# Patient Record
Sex: Male | Born: 1985 | Race: Black or African American | Hispanic: No | Marital: Single | Smoking: Current every day smoker
Health system: Southern US, Community
[De-identification: ages and names within clinical notes are randomized; demographics above are authoritative.]

## PROBLEM LIST (undated history)

## (undated) ENCOUNTER — Emergency Department: Admission: EM | Payer: Self-pay | Source: Home / Self Care

## (undated) DIAGNOSIS — J45909 Unspecified asthma, uncomplicated: Secondary | ICD-10-CM

---

## 2011-03-12 ENCOUNTER — Other Ambulatory Visit (HOSPITAL_BASED_OUTPATIENT_CLINIC_OR_DEPARTMENT_OTHER): Payer: Self-pay | Admitting: Surgery

## 2011-03-12 ENCOUNTER — Other Ambulatory Visit (EMERGENCY_DEPARTMENT_HOSPITAL): Payer: Self-pay

## 2011-03-12 ENCOUNTER — Other Ambulatory Visit (EMERGENCY_DEPARTMENT_HOSPITAL): Payer: Self-pay | Admitting: Emergency Medicine

## 2011-03-12 ENCOUNTER — Other Ambulatory Visit (HOSPITAL_COMMUNITY): Payer: Self-pay

## 2011-03-12 ENCOUNTER — Inpatient Hospital Stay (HOSPITAL_COMMUNITY): Payer: No Typology Code available for payment source | Admitting: Emergency Medicine

## 2011-03-12 ENCOUNTER — Inpatient Hospital Stay (HOSPITAL_BASED_OUTPATIENT_CLINIC_OR_DEPARTMENT_OTHER)
Admission: EM | Admit: 2011-03-12 | Discharge: 2011-05-01 | DRG: 793 | Disposition: A | Discharge status: Home / Self Care | Payer: No Typology Code available for payment source | Attending: Orthopaedic Surgery | Admitting: Orthopaedic Surgery

## 2011-03-12 DIAGNOSIS — Y990 Civilian activity done for income or pay: Secondary | ICD-10-CM

## 2011-03-12 DIAGNOSIS — S322XXA Fracture of coccyx, initial encounter for closed fracture: Secondary | ICD-10-CM | POA: Diagnosis present

## 2011-03-12 DIAGNOSIS — S72009A Fracture of unspecified part of neck of unspecified femur, initial encounter for closed fracture: Secondary | ICD-10-CM | POA: Diagnosis present

## 2011-03-12 DIAGNOSIS — Y831 Surgical operation with implant of artificial internal device as the cause of abnormal reaction of the patient, or of later complication, without mention of misadventure at the time of the procedure: Secondary | ICD-10-CM | POA: Diagnosis not present

## 2011-03-12 DIAGNOSIS — J9589 Other postprocedural complications and disorders of respiratory system, not elsewhere classified: Secondary | ICD-10-CM | POA: Diagnosis not present

## 2011-03-12 DIAGNOSIS — K661 Hemoperitoneum: Secondary | ICD-10-CM | POA: Diagnosis present

## 2011-03-12 DIAGNOSIS — D62 Acute posthemorrhagic anemia: Secondary | ICD-10-CM | POA: Diagnosis not present

## 2011-03-12 DIAGNOSIS — T847XXA Infection and inflammatory reaction due to other internal orthopedic prosthetic devices, implants and grafts, initial encounter: Secondary | ICD-10-CM | POA: Diagnosis not present

## 2011-03-12 DIAGNOSIS — S3210XA Unspecified fracture of sacrum, initial encounter for closed fracture: Secondary | ICD-10-CM | POA: Diagnosis present

## 2011-03-12 DIAGNOSIS — S3600XA Unspecified injury of spleen, initial encounter: Principal | ICD-10-CM | POA: Diagnosis present

## 2011-03-12 DIAGNOSIS — M869 Osteomyelitis, unspecified: Secondary | ICD-10-CM | POA: Diagnosis not present

## 2011-03-12 DIAGNOSIS — S22009A Unspecified fracture of unspecified thoracic vertebra, initial encounter for closed fracture: Secondary | ICD-10-CM | POA: Diagnosis present

## 2011-03-12 DIAGNOSIS — S71109A Unspecified open wound, unspecified thigh, initial encounter: Secondary | ICD-10-CM | POA: Diagnosis present

## 2011-03-12 DIAGNOSIS — S72309A Unspecified fracture of shaft of unspecified femur, initial encounter for closed fracture: Secondary | ICD-10-CM | POA: Diagnosis present

## 2011-03-12 DIAGNOSIS — Y92410 Unspecified street and highway as the place of occurrence of the external cause: Secondary | ICD-10-CM

## 2011-03-12 LAB — URINALYSIS COMPLETE, URN
Bacteria, URN: NONE SEEN
Bacteria, URN: NONE SEEN
Bilirubin (Qual), URN: NEGATIVE
Bilirubin (Qual), URN: NEGATIVE
Cast_Hyaline, URN: <1 /LPF — AB
Epith Cells_Renal/Trans,URN: NEGATIVE /HPF
Epith Cells_Renal/Trans,URN: NEGATIVE /HPF
Epith Cells_Squamous, URN: NEGATIVE /LPF
Epith Cells_Squamous, URN: NEGATIVE /LPF
Ketones, URN: NEGATIVE mg/dL
Ketones, URN: NEGATIVE mg/dL
Leukocyte Esterase, URN: NEGATIVE
Leukocyte Esterase, URN: NEGATIVE
Nitrite, URN: NEGATIVE
Nitrite, URN: NEGATIVE
Protein (Alb Semiquant), URN: NEGATIVE mg/dL
Protein (Alb Semiquant), URN: NEGATIVE mg/dL
RBC, URN: NEGATIVE /HPF
Specific Gravity, URN: >1.04 g/mL — ABNORMAL HIGH (ref 1.002–1.027)
Specific Gravity, URN: >1.04 g/mL — ABNORMAL HIGH (ref 1.002–1.027)
WBC, URN: NEGATIVE /HPF
WBC, URN: NEGATIVE /HPF

## 2011-03-12 LAB — EMERGENCY HEMORRHAGE PANEL
Fibrinogen: 152 mg/dL (ref 150–400)
Fibrinogen: 193 mg/dL (ref 150–400)
Hematocrit: 36 % — ABNORMAL LOW (ref 38–50)
Hematocrit: 32 % — ABNORMAL LOW (ref 38–50)
Platelet Count: 102 10*3/uL — ABNORMAL LOW (ref 150–400)
Platelet Count: 117 10*3/uL — ABNORMAL LOW (ref 150–400)
Prothrombin INR: 1.3 (ref 0.8–1.3)
Prothrombin INR: 1.3 (ref 0.8–1.3)
Prothrombin Time Patient: 15.2 s (ref 10.7–15.6)
Prothrombin Time Patient: 15.3 s (ref 10.7–15.6)

## 2011-03-12 LAB — COMPREHENSIVE METABOLIC PANEL
ALT (GPT): 84 U/L — ABNORMAL HIGH (ref 10–64)
AST (GOT): 63 U/L — ABNORMAL HIGH (ref 15–40)
Albumin: 2.3 g/dL — ABNORMAL LOW (ref 3.5–5.2)
Alkaline Phosphatase (Total): 43 U/L (ref 35–109)
Anion Gap: 9 (ref 3–11)
Bilirubin (Total): 0.9 mg/dL (ref 0.2–1.3)
Calcium: 6.9 mg/dL — ABNORMAL LOW (ref 8.9–10.2)
Carbon Dioxide, Total: 21 mEq/L — ABNORMAL LOW (ref 22–32)
Chloride: 112 mEq/L — ABNORMAL HIGH (ref 98–108)
Creatinine: 0.98 mg/dL (ref 0.51–1.18)
GFR, Calc, African American: >60 mL/min (ref >59)
GFR, Calc, European American: >60 mL/min (ref >59)
Glucose: 227 mg/dL — ABNORMAL HIGH (ref 62–125)
Potassium: 3.9 mEq/L (ref 3.7–5.2)
Protein (Total): 3.5 g/dL — ABNORMAL LOW (ref 6.0–8.2)
Sodium: 142 mEq/L (ref 136–145)
Urea Nitrogen: 19 mg/dL (ref 8–21)

## 2011-03-12 LAB — CBC HEMORRHAGE PANEL
Fibrinogen: 138 mg/dL — ABNORMAL LOW (ref 150–400)
Hematocrit: 29 % — ABNORMAL LOW (ref 38–50)
Hemoglobin: 10.1 g/dL — ABNORMAL LOW (ref 13.0–18.0)
MCH: 31.6 pg (ref 27.3–33.6)
MCHC: 35.2 g/dL (ref 32.2–36.5)
MCV: 90 fL (ref 81–98)
Platelet Count: 167 10*3/uL (ref 150–400)
Prothrombin INR: 1.3 (ref 0.8–1.3)
Prothrombin Time Patient: 15.4 s (ref 10.7–15.6)
RBC: 3.2 mil/uL — ABNORMAL LOW (ref 4.40–5.60)
RDW-CV: 11.8 % (ref 11.6–14.4)
WBC: 29.89 10*3/uL — ABNORMAL HIGH (ref 4.30–10.00)

## 2011-03-12 LAB — BLOOD GAS PANEL 9, ART
Bicarbonate: 19 mEq/L — ABNORMAL LOW (ref 22–26)
Bicarbonate: 18 mEq/L — ABNORMAL LOW (ref 22–26)
Bicarbonate: 20 mEq/L — ABNORMAL LOW (ref 22–26)
Bicarbonate: 20 mEq/L — ABNORMAL LOW (ref 22–26)
Calcium (Ionized): 1.05 mmol/L — ABNORMAL LOW (ref 1.18–1.38)
Calcium (Ionized): 0.98 mmol/L — ABNORMAL LOW (ref 1.18–1.38)
Calcium (Ionized): 1 mmol/L — ABNORMAL LOW (ref 1.18–1.38)
Calcium (Ionized): 1.01 mmol/L — ABNORMAL LOW (ref 1.18–1.38)
Carboxyhemoglobin, ART: 1 %
Carboxyhemoglobin, ART: 1 %
Carboxyhemoglobin, ART: 1.3 %
Carboxyhemoglobin, ART: 1.3 %
Glucose: 152 mg/dL — ABNORMAL HIGH (ref 62–125)
Glucose: 162 mg/dL — ABNORMAL HIGH (ref 62–125)
Glucose: 132 mg/dL — ABNORMAL HIGH (ref 62–125)
Glucose: 127 mg/dL — ABNORMAL HIGH (ref 62–125)
Hematocrit: 35 % — ABNORMAL LOW (ref 38–50)
Hematocrit: 35 % — ABNORMAL LOW (ref 38–50)
Hematocrit: 34 % — ABNORMAL LOW (ref 38–50)
Hematocrit: 32 % — ABNORMAL LOW (ref 38–50)
L Lactate (Direct), ART WB: 5.5 mmol/L — ABNORMAL HIGH (ref 0.4–1.0)
L Lactate (Direct), ART WB: 6.1 mmol/L — ABNORMAL HIGH (ref 0.4–1.0)
L Lactate (Direct), ART WB: 5.8 mmol/L — ABNORMAL HIGH (ref 0.4–1.0)
L Lactate (Direct), ART WB: 5.8 mmol/L — ABNORMAL HIGH (ref 0.4–1.0)
Methemoglobin, ART: 1.2 % (ref <3.0)
Methemoglobin, ART: 1.1 % (ref <3.0)
Methemoglobin, ART: 1.1 % (ref <3.0)
Methemoglobin, ART: 1.1 % (ref <3.0)
O2 Content: 16.6 VOL% (ref 15–23)
O2 Content: 15.7 VOL% (ref 15–23)
O2 Content: 15.3 VOL% (ref 15–23)
O2 Content: 14.2 VOL% — ABNORMAL LOW (ref 15–23)
O2 Sat (Frac.), ART: 98 % (ref 94–99)
O2 Sat (Frac.), ART: 96 % (ref 94–99)
O2 Sat (Frac.), ART: 97 % (ref 94–99)
O2 Sat (Frac.), ART: 97 % (ref 94–99)
Potassium: 5.3 mEq/L — ABNORMAL HIGH (ref 3.7–5.2)
Potassium: 5.1 mEq/L (ref 3.7–5.2)
Potassium: 5 mEq/L (ref 3.7–5.2)
Potassium: 4.5 mEq/L (ref 3.7–5.2)
Sodium: 137 mEq/L (ref 136–145)
Sodium: 137 mEq/L (ref 136–145)
Sodium: 138 mEq/L (ref 136–145)
Sodium: 138 mEq/L (ref 136–145)
Total Hemoglobin, ART: 11.4 g/dL — ABNORMAL LOW (ref 13.0–18.0)
Total Hemoglobin, ART: 11.5 g/dL — ABNORMAL LOW (ref 13.0–18.0)
Total Hemoglobin, ART: 10.9 g/dL — ABNORMAL LOW (ref 13.0–18.0)
Total Hemoglobin, ART: 10.2 g/dL — ABNORMAL LOW (ref 13.0–18.0)
pCO2, ART: 38 mm Hg (ref 33–48)
pCO2, ART: 39 mm Hg (ref 33–48)
pCO2, ART: 37 mm Hg (ref 33–48)
pCO2, ART: 35 mm Hg (ref 33–48)
pH, ART: 7.31 — ABNORMAL LOW (ref 7.35–7.45)
pH, ART: 7.28 — ABNORMAL LOW (ref 7.35–7.45)
pH, ART: 7.36 (ref 7.35–7.45)
pH, ART: 7.38 (ref 7.35–7.45)
pO2: 389 mm Hg — ABNORMAL HIGH (ref 80–104)
pO2: 115 mm Hg — ABNORMAL HIGH (ref 80–104)
pO2: 169 mm Hg — ABNORMAL HIGH (ref 80–104)
pO2: 162 mm Hg — ABNORMAL HIGH (ref 80–104)

## 2011-03-12 LAB — CODE PANEL, ARTERIAL, W/ LACT, ICA, NA, K, GLU, HBCO, MET HGB
Base Deficit Blood, ART: 7 mEq/L
Base Deficit Blood, ART: 8.9 mEq/L
Base Deficit Blood, ART: NEGATIVE
Base Deficit Blood, ART: NEGATIVE
Base Deficit Blood, ART: 6.4 mEq/L
Base Deficit Blood, ART: NEGATIVE
Bicarbonate: 19 mEq/L — ABNORMAL LOW (ref 22–26)
Bicarbonate: 20 mEq/L — ABNORMAL LOW (ref 22–26)
Bicarbonate: 20 mEq/L — ABNORMAL LOW (ref 22–26)
Calcium (Ionized): 1.06 mmol/L — ABNORMAL LOW (ref 1.18–1.38)
Calcium (Ionized): 1.07 mmol/L — ABNORMAL LOW (ref 1.18–1.38)
Calcium (Ionized): 1.02 mmol/L — ABNORMAL LOW (ref 1.18–1.38)
Carboxyhemoglobin, ART: 0.7 %
Carboxyhemoglobin, ART: 0.8 %
Carboxyhemoglobin, ART: 0.6 %
Glucose: 217 mg/dL — ABNORMAL HIGH (ref 62–125)
Glucose: 218 mg/dL — ABNORMAL HIGH (ref 62–125)
Glucose: 177 mg/dL — ABNORMAL HIGH (ref 62–125)
Hematocrit: 31 % — ABNORMAL LOW (ref 38–50)
Hematocrit: 38 % (ref 38–50)
Hematocrit: 39 % (ref 38–50)
Hemoglobin: 10 g/dL — ABNORMAL LOW (ref 13.0–18.0)
Hemoglobin: 12.5 g/dL — ABNORMAL LOW (ref 13.0–18.0)
Hemoglobin: 12.8 g/dL — ABNORMAL LOW (ref 13.0–18.0)
L Lactate (Direct), ART WB: 4.8 mmol/L — ABNORMAL HIGH (ref 0.4–1.0)
L Lactate (Direct), ART WB: 5.4 mmol/L — ABNORMAL HIGH (ref 0.4–1.0)
L Lactate (Direct), ART WB: 5.2 mmol/L — ABNORMAL HIGH (ref 0.4–1.0)
Methemoglobin, ART: 1.3 % (ref <3.0)
Methemoglobin, ART: 1.3 % (ref <3.0)
Methemoglobin, ART: 0.6 % (ref <3.0)
O2 Content: 13.7 VOL% — ABNORMAL LOW (ref 15–23)
O2 Content: 17.9 VOL% (ref 15–23)
O2 Content: 18.9 VOL% (ref 15–23)
O2 Sat (Frac.), ART: 96 % (ref 94–99)
O2 Sat (Frac.), ART: 97 % (ref 94–99)
O2 Sat (Frac.), ART: 99 % (ref 94–99)
O2 Sat (Func.), ART: 98 %
O2 Sat (Func.), ART: 99 %
O2 Sat (Func.), ART: 100 %
Potassium: 3.8 mEq/L (ref 3.7–5.2)
Potassium: 4.3 mEq/L (ref 3.7–5.2)
Potassium: 5.2 mEq/L (ref 3.7–5.2)
Sodium: 136 mEq/L (ref 136–145)
Sodium: 138 mEq/L (ref 136–145)
Sodium: 139 mEq/L (ref 136–145)
Total Hemoglobin, ART: 10 g/dL — ABNORMAL LOW (ref 13.0–18.0)
Total Hemoglobin, ART: 12.5 g/dL — ABNORMAL LOW (ref 13.0–18.0)
Total Hemoglobin, ART: 12.8 g/dL — ABNORMAL LOW (ref 13.0–18.0)
pCO2, ART: 49 mm Hg — ABNORMAL HIGH (ref 33–48)
pCO2, ART: 53 mm Hg — ABNORMAL HIGH (ref 33–48)
pCO2, ART: 47 mm Hg (ref 33–48)
pH, ART: 7.2 — ABNORMAL LOW (ref 7.35–7.45)
pH, ART: 7.21 — ABNORMAL LOW (ref 7.35–7.45)
pH, ART: 7.26 — ABNORMAL LOW (ref 7.35–7.45)
pO2: 128 mm Hg — ABNORMAL HIGH (ref 80–104)
pO2: 338 mm Hg — ABNORMAL HIGH (ref 80–104)
pO2: 442 mm Hg — ABNORMAL HIGH (ref 80–104)

## 2011-03-12 LAB — BLOOD GAS, ARTERIAL (NO ELECTROLYTES)
Base Deficit Blood, ART: 4.2 mEq/L
Base Deficit Blood, ART: NEGATIVE
Base Deficit Blood, ART: 2.7 mEq/L
Base Deficit Blood, ART: NEGATIVE
Base Deficit Blood, ART: 1 mEq/L
Base Deficit Blood, ART: NEGATIVE
Bicarbonate: 21 mEq/L — ABNORMAL LOW (ref 22–26)
Bicarbonate: 22 mEq/L (ref 22–26)
Bicarbonate: 23 mEq/L (ref 22–26)
Fi (O2) [%]: 50 %
Fi (O2) [%]: 40 %
Fi (O2) [%]: 40 %
PEEP (Pos End Expir Pres): 5
PEEP (Pos End Expir Pres): 5
PEEP (Pos End Expir Pres): 5
pCO2, ART: 43 mm Hg (ref 33–48)
pCO2, ART: 38 mm Hg (ref 33–48)
pCO2, ART: 37 mm Hg (ref 33–48)
pH, ART: 7.32 — ABNORMAL LOW (ref 7.35–7.45)
pH, ART: 7.37 (ref 7.35–7.45)
pH, ART: 7.41 (ref 7.35–7.45)
pO2: 177 mm Hg — ABNORMAL HIGH (ref 80–104)
pO2: 172 mm Hg — ABNORMAL HIGH (ref 80–104)
pO2: 160 mm Hg — ABNORMAL HIGH (ref 80–104)

## 2011-03-12 LAB — PHOSPHATE
Phosphate: 4.1 mg/dL (ref 2.5–4.5)
Phosphate: 2.8 mg/dL (ref 2.5–4.5)

## 2011-03-12 LAB — BASIC METABOLIC PANEL
Anion Gap: 8 (ref 3–11)
Anion Gap: 7 (ref 3–11)
Calcium: 6.6 mg/dL — ABNORMAL LOW (ref 8.9–10.2)
Calcium: 6.9 mg/dL — ABNORMAL LOW (ref 8.9–10.2)
Carbon Dioxide, Total: 22 mEq/L (ref 22–32)
Carbon Dioxide, Total: 23 mEq/L (ref 22–32)
Chloride: 109 mEq/L — ABNORMAL HIGH (ref 98–108)
Chloride: 109 mEq/L — ABNORMAL HIGH (ref 98–108)
Creatinine: 0.95 mg/dL (ref 0.51–1.18)
Creatinine: 0.86 mg/dL (ref 0.51–1.18)
GFR, Calc, African American: >60 mL/min (ref >59)
GFR, Calc, African American: >60 mL/min (ref >59)
GFR, Calc, European American: >60 mL/min (ref >59)
GFR, Calc, European American: >60 mL/min (ref >59)
Glucose: 143 mg/dL — ABNORMAL HIGH (ref 62–125)
Glucose: 159 mg/dL — ABNORMAL HIGH (ref 62–125)
Potassium: 4.5 mEq/L (ref 3.7–5.2)
Potassium: 4.1 mEq/L (ref 3.7–5.2)
Sodium: 139 mEq/L (ref 136–145)
Sodium: 139 mEq/L (ref 136–145)
Urea Nitrogen: 14 mg/dL (ref 8–21)
Urea Nitrogen: 14 mg/dL (ref 8–21)

## 2011-03-12 LAB — PROTHROMBIN & PTT
Partial Thromboplastin Time: 28 s (ref 22–35)
Prothrombin INR: 1.3 (ref 0.8–1.3)
Prothrombin Time Patient: 15.2 s (ref 10.7–15.6)

## 2011-03-12 LAB — CBC (HEMOGRAM)
Hematocrit: 27 % — ABNORMAL LOW (ref 38–50)
Hematocrit: 27 % — ABNORMAL LOW (ref 38–50)
Hemoglobin: 9.8 g/dL — ABNORMAL LOW (ref 13.0–18.0)
Hemoglobin: 9.8 g/dL — ABNORMAL LOW (ref 13.0–18.0)
MCH: 31.1 pg (ref 27.3–33.6)
MCH: 30.9 pg (ref 27.3–33.6)
MCHC: 35.8 g/dL (ref 32.2–36.5)
MCHC: 35.8 g/dL (ref 32.2–36.5)
MCV: 87 fL (ref 81–98)
MCV: 86 fL (ref 81–98)
Platelet Count: 121 10*3/uL — ABNORMAL LOW (ref 150–400)
Platelet Count: 120 10*3/uL — ABNORMAL LOW (ref 150–400)
RBC: 3.15 mil/uL — ABNORMAL LOW (ref 4.40–5.60)
RBC: 3.17 mil/uL — ABNORMAL LOW (ref 4.40–5.60)
RDW-CV: 12.8 % (ref 11.6–14.4)
RDW-CV: 12.8 % (ref 11.6–14.4)
WBC: 11.78 10*3/uL — ABNORMAL HIGH (ref 4.30–10.00)
WBC: 13.84 10*3/uL — ABNORMAL HIGH (ref 4.30–10.00)

## 2011-03-12 LAB — AMYLASE: Amylase Total: 38 U/L (ref 27–106)

## 2011-03-12 LAB — CALCIUM (IONIZED), WB
Calcium (Ionized): 1.02 mmol/L — ABNORMAL LOW (ref 1.18–1.38)
Calcium (Ionized): 1.01 mmol/L — ABNORMAL LOW (ref 1.18–1.38)

## 2011-03-12 LAB — L_LACTATE, ARTERIAL WHOLE BLOOD
L Lactate (Direct), ART WB: 6.3 mmol/L — ABNORMAL HIGH (ref 0.4–1.0)
L Lactate (Direct), ART WB: 4.7 mmol/L — ABNORMAL HIGH (ref 0.4–1.0)
L Lactate (Direct), ART WB: 4.8 mmol/L — ABNORMAL HIGH (ref 0.4–1.0)

## 2011-03-12 LAB — MAGNESIUM
Magnesium: 1.6 mg/dL — ABNORMAL LOW (ref 1.8–2.4)
Magnesium: 1.6 mg/dL — ABNORMAL LOW (ref 1.8–2.4)

## 2011-03-12 LAB — 1ST EXTRA LIME GREEN TOP

## 2011-03-12 LAB — CREATININE, WBLD: Creatinine: 0.88 mg/dL (ref 0.51–1.18)

## 2011-03-12 LAB — ALCOHOL (ETHYL): Alcohol (Ethyl): NEGATIVE mg/dL

## 2011-03-13 ENCOUNTER — Other Ambulatory Visit (HOSPITAL_COMMUNITY): Payer: Self-pay

## 2011-03-13 ENCOUNTER — Other Ambulatory Visit (HOSPITAL_COMMUNITY): Payer: Self-pay | Admitting: Anesthesiology

## 2011-03-13 LAB — BASIC METABOLIC PANEL
Anion Gap: 8 (ref 3–11)
Anion Gap: 6 (ref 3–11)
Anion Gap: 3 (ref 3–11)
Anion Gap: 3 (ref 3–11)
Calcium: 7 mg/dL — ABNORMAL LOW (ref 8.9–10.2)
Calcium: 7.2 mg/dL — ABNORMAL LOW (ref 8.9–10.2)
Calcium: 7.2 mg/dL — ABNORMAL LOW (ref 8.9–10.2)
Calcium: 7.2 mg/dL — ABNORMAL LOW (ref 8.9–10.2)
Carbon Dioxide, Total: 25 mEq/L (ref 22–32)
Carbon Dioxide, Total: 26 mEq/L (ref 22–32)
Carbon Dioxide, Total: 29 mEq/L (ref 22–32)
Carbon Dioxide, Total: 30 mEq/L (ref 22–32)
Chloride: 106 mEq/L (ref 98–108)
Chloride: 108 mEq/L (ref 98–108)
Chloride: 109 mEq/L — ABNORMAL HIGH (ref 98–108)
Chloride: 107 mEq/L (ref 98–108)
Creatinine: 0.89 mg/dL (ref 0.51–1.18)
Creatinine: 0.83 mg/dL (ref 0.51–1.18)
Creatinine: 0.77 mg/dL (ref 0.51–1.18)
Creatinine: 0.66 mg/dL (ref 0.51–1.18)
GFR, Calc, African American: >60 mL/min (ref >59)
GFR, Calc, African American: >60 mL/min (ref >59)
GFR, Calc, African American: >60 mL/min (ref >59)
GFR, Calc, African American: >60 mL/min (ref >59)
GFR, Calc, European American: >60 mL/min (ref >59)
GFR, Calc, European American: >60 mL/min (ref >59)
GFR, Calc, European American: >60 mL/min (ref >59)
GFR, Calc, European American: >60 mL/min (ref >59)
Glucose: 140 mg/dL — ABNORMAL HIGH (ref 62–125)
Glucose: 136 mg/dL — ABNORMAL HIGH (ref 62–125)
Glucose: 134 mg/dL — ABNORMAL HIGH (ref 62–125)
Glucose: 135 mg/dL — ABNORMAL HIGH (ref 62–125)
Potassium: 3.8 mEq/L (ref 3.7–5.2)
Potassium: 3.6 mEq/L — ABNORMAL LOW (ref 3.7–5.2)
Potassium: 4.1 mEq/L (ref 3.7–5.2)
Potassium: 3.8 mEq/L (ref 3.7–5.2)
Sodium: 139 mEq/L (ref 136–145)
Sodium: 140 mEq/L (ref 136–145)
Sodium: 141 mEq/L (ref 136–145)
Sodium: 140 mEq/L (ref 136–145)
Urea Nitrogen: 14 mg/dL (ref 8–21)
Urea Nitrogen: 14 mg/dL (ref 8–21)
Urea Nitrogen: 13 mg/dL (ref 8–21)
Urea Nitrogen: 12 mg/dL (ref 8–21)

## 2011-03-13 LAB — L_LACTATE, ARTERIAL WHOLE BLOOD
L Lactate (Direct), ART WB: 3.6 mmol/L — ABNORMAL HIGH (ref 0.4–1.0)
L Lactate (Direct), ART WB: 3.2 mmol/L — ABNORMAL HIGH (ref 0.4–1.0)
L Lactate (Direct), ART WB: 2.2 mmol/L — ABNORMAL HIGH (ref 0.4–1.0)
L Lactate (Direct), ART WB: 1.4 mmol/L — ABNORMAL HIGH (ref 0.4–1.0)
L Lactate (Direct), ART WB: 0.9 mmol/L (ref 0.4–1.0)
L Lactate (Direct), ART WB: 1.2 mmol/L — ABNORMAL HIGH (ref 0.4–1.0)

## 2011-03-13 LAB — PREPARE CRYOPRECIPITATE: Units Ordered: 1

## 2011-03-13 LAB — CBC (HEMOGRAM)
Hematocrit: 25 % — ABNORMAL LOW (ref 38–50)
Hematocrit: 25 % — ABNORMAL LOW (ref 38–50)
Hematocrit: 22 % — ABNORMAL LOW (ref 38–50)
Hematocrit: 21 % — ABNORMAL LOW (ref 38–50)
Hemoglobin: 8.8 g/dL — ABNORMAL LOW (ref 13.0–18.0)
Hemoglobin: 8.7 g/dL — ABNORMAL LOW (ref 13.0–18.0)
Hemoglobin: 7.9 g/dL — ABNORMAL LOW (ref 13.0–18.0)
Hemoglobin: 7.3 g/dL — ABNORMAL LOW (ref 13.0–18.0)
MCH: 31.2 pg (ref 27.3–33.6)
MCH: 31 pg (ref 27.3–33.6)
MCH: 31.6 pg (ref 27.3–33.6)
MCH: 30.9 pg (ref 27.3–33.6)
MCHC: 35.9 g/dL (ref 32.2–36.5)
MCHC: 35.5 g/dL (ref 32.2–36.5)
MCHC: 35.3 g/dL (ref 32.2–36.5)
MCHC: 34.4 g/dL (ref 32.2–36.5)
MCV: 87 fL (ref 81–98)
MCV: 87 fL (ref 81–98)
MCV: 90 fL (ref 81–98)
MCV: 90 fL (ref 81–98)
Platelet Count: 106 10*3/uL — ABNORMAL LOW (ref 150–400)
Platelet Count: 105 10*3/uL — ABNORMAL LOW (ref 150–400)
Platelet Count: 113 10*3/uL — ABNORMAL LOW (ref 150–400)
Platelet Count: 119 10*3/uL — ABNORMAL LOW (ref 150–400)
RBC: 2.82 mil/uL — ABNORMAL LOW (ref 4.40–5.60)
RBC: 2.81 mil/uL — ABNORMAL LOW (ref 4.40–5.60)
RBC: 2.5 mil/uL — ABNORMAL LOW (ref 4.40–5.60)
RBC: 2.36 mil/uL — ABNORMAL LOW (ref 4.40–5.60)
RDW-CV: 13.1 % (ref 11.6–14.4)
RDW-CV: 13.1 % (ref 11.6–14.4)
RDW-CV: 13.4 % (ref 11.6–14.4)
RDW-CV: 13 % (ref 11.6–14.4)
WBC: 16.3 10*3/uL — ABNORMAL HIGH (ref 4.30–10.00)
WBC: 16.25 10*3/uL — ABNORMAL HIGH (ref 4.30–10.00)
WBC: 19.2 10*3/uL — ABNORMAL HIGH (ref 4.30–10.00)
WBC: 17.37 10*3/uL — ABNORMAL HIGH (ref 4.30–10.00)

## 2011-03-13 LAB — BLOOD GAS, ARTERIAL (NO ELECTROLYTES)
Base Excess Blood, ART: 3.2 mEq/L — ABNORMAL HIGH (ref 0.0–3.0)
Base Excess Blood, ART: 3.1 mEq/L — ABNORMAL HIGH (ref 0.0–3.0)
Base Excess Blood, ART: 3.9 mEq/L — ABNORMAL HIGH (ref 0.0–3.0)
Base Excess Blood, ART: 4.3 mEq/L — ABNORMAL HIGH (ref 0.0–3.0)
Base Excess Blood, ART: 5.4 mEq/L — ABNORMAL HIGH (ref 0.0–3.0)
Bicarbonate: 27 mEq/L — ABNORMAL HIGH (ref 22–26)
Bicarbonate: 29 mEq/L — ABNORMAL HIGH (ref 22–26)
Bicarbonate: 29 mEq/L — ABNORMAL HIGH (ref 22–26)
Bicarbonate: 30 mEq/L — ABNORMAL HIGH (ref 22–26)
Bicarbonate: 31 mEq/L — ABNORMAL HIGH (ref 22–26)
Fi (O2) [%]: 30 %
Fi (O2) [%]: 50 %
Fi (O2) [%]: 50 %
PEEP (Pos End Expir Pres): 5
Rx (O2) L: 15 L
Rx (O2) L: 8 L
pCO2, ART: 37 mm Hg (ref 33–48)
pCO2, ART: 60 mm Hg — ABNORMAL HIGH (ref 33–48)
pCO2, ART: 54 mm Hg — ABNORMAL HIGH (ref 33–48)
pCO2, ART: 57 mm Hg — ABNORMAL HIGH (ref 33–48)
pCO2, ART: 52 mm Hg — ABNORMAL HIGH (ref 33–48)
pH, ART: 7.47 — ABNORMAL HIGH (ref 7.35–7.45)
pH, ART: 7.31 — ABNORMAL LOW (ref 7.35–7.45)
pH, ART: 7.35 (ref 7.35–7.45)
pH, ART: 7.34 — ABNORMAL LOW (ref 7.35–7.45)
pH, ART: 7.39 (ref 7.35–7.45)
pO2: 104 mm Hg (ref 80–104)
pO2: 75 mm Hg — ABNORMAL LOW (ref 80–104)
pO2: 92 mm Hg (ref 80–104)
pO2: 81 mm Hg (ref 80–104)
pO2: 91 mm Hg (ref 80–104)

## 2011-03-13 LAB — BLOOD GAS, ARTERIAL, W/ ICA, NA, K, LACT, GLU
Base Excess Blood, ART: 2.8 mEq/L (ref 0.0–3.0)
Bicarbonate: 28 mEq/L — ABNORMAL HIGH (ref 22–26)
Calcium (Ionized): 1.06 mmol/L — ABNORMAL LOW (ref 1.18–1.38)
Fi (O2) [%]: 40 %
Glucose: 128 mg/dL — ABNORMAL HIGH (ref 62–125)
Hematocrit: 24 % — ABNORMAL LOW (ref 38–50)
Hemoglobin: 7.7 g/dL — ABNORMAL LOW (ref 13.0–18.0)
Potassium: 3.3 mEq/L — ABNORMAL LOW (ref 3.7–5.2)
Sodium: 140 mEq/L (ref 136–145)
pCO2, ART: 47 mm Hg (ref 33–48)
pH, ART: 7.39 (ref 7.35–7.45)
pO2: 107 mm Hg — ABNORMAL HIGH (ref 80–104)

## 2011-03-13 LAB — IONIZED CALCIUM, PLASMA: Ionized Calcium, Plasma: 1.12 mmol/L — ABNORMAL LOW (ref 1.18–1.38)

## 2011-03-13 LAB — BLOOD GAS, ARTERIAL, W/ ICA
Base Excess Blood, ART: 1.9 mEq/L (ref 0.0–3.0)
Base Excess Blood, ART: 3.5 mEq/L — ABNORMAL HIGH (ref 0.0–3.0)
Bicarbonate: 26 mEq/L (ref 22–26)
Bicarbonate: 30 mEq/L — ABNORMAL HIGH (ref 22–26)
Calcium (Ionized): 1.06 mmol/L — ABNORMAL LOW (ref 1.18–1.38)
Calcium (Ionized): 1.1 mmol/L — ABNORMAL LOW (ref 1.18–1.38)
Fi (O2) [%]: 30 %
PEEP (Pos End Expir Pres): 5
Rx (O2) L: 4 L
pCO2, ART: 38 mm Hg (ref 33–48)
pCO2, ART: 59 mm Hg — ABNORMAL HIGH (ref 33–48)
pH, ART: 7.44 (ref 7.35–7.45)
pH, ART: 7.32 — ABNORMAL LOW (ref 7.35–7.45)
pO2: 101 mm Hg (ref 80–104)
pO2: 76 mm Hg — ABNORMAL LOW (ref 80–104)

## 2011-03-13 LAB — TYPE AND CROSSMATCH
ABO/Rh: O POS
Antibody Screen: NEGATIVE
Units Ordered: 10

## 2011-03-13 LAB — MAGNESIUM
Magnesium: 2.2 mg/dL (ref 1.8–2.4)
Magnesium: 2 mg/dL (ref 1.8–2.4)
Magnesium: 2.2 mg/dL (ref 1.8–2.4)
Magnesium: 2.4 mg/dL (ref 1.8–2.4)

## 2011-03-13 LAB — PHOSPHATE
Phosphate: 2.1 mg/dL — ABNORMAL LOW (ref 2.5–4.5)
Phosphate: 2.2 mg/dL — ABNORMAL LOW (ref 2.5–4.5)
Phosphate: 2.5 mg/dL (ref 2.5–4.5)
Phosphate: 1.4 mg/dL — ABNORMAL LOW (ref 2.5–4.5)

## 2011-03-13 LAB — CALCIUM (IONIZED), WB: Calcium (Ionized): 1.06 mmol/L — ABNORMAL LOW (ref 1.18–1.38)

## 2011-03-13 LAB — PREPARE PLASMA: Units Ordered: 6

## 2011-03-13 LAB — CALCIUM, (REFLEXIVE IONIZED)

## 2011-03-13 LAB — PREPARE PLATELETS: Units Ordered: 1

## 2011-03-14 ENCOUNTER — Other Ambulatory Visit (HOSPITAL_COMMUNITY): Payer: Self-pay

## 2011-03-14 LAB — BASIC METABOLIC PANEL
Anion Gap: 4 (ref 3–11)
Anion Gap: 5 (ref 3–11)
Calcium: 7.2 mg/dL — ABNORMAL LOW (ref 8.9–10.2)
Calcium: 7.3 mg/dL — ABNORMAL LOW (ref 8.9–10.2)
Carbon Dioxide, Total: 30 mEq/L (ref 22–32)
Carbon Dioxide, Total: 31 mEq/L (ref 22–32)
Chloride: 107 mEq/L (ref 98–108)
Chloride: 106 mEq/L (ref 98–108)
Creatinine: 0.75 mg/dL (ref 0.51–1.18)
Creatinine: 0.63 mg/dL (ref 0.51–1.18)
GFR, Calc, African American: >60 mL/min (ref >59)
GFR, Calc, African American: >60 mL/min (ref >59)
GFR, Calc, European American: >60 mL/min (ref >59)
GFR, Calc, European American: >60 mL/min (ref >59)
Glucose: 131 mg/dL — ABNORMAL HIGH (ref 62–125)
Glucose: 140 mg/dL — ABNORMAL HIGH (ref 62–125)
Potassium: 3.9 mEq/L (ref 3.7–5.2)
Potassium: 4 mEq/L (ref 3.7–5.2)
Sodium: 141 mEq/L (ref 136–145)
Sodium: 142 mEq/L (ref 136–145)
Urea Nitrogen: 12 mg/dL (ref 8–21)
Urea Nitrogen: 12 mg/dL (ref 8–21)

## 2011-03-14 LAB — BLOOD GAS, ARTERIAL (NO ELECTROLYTES)
Base Excess Blood, ART: 6.4 mEq/L — ABNORMAL HIGH (ref 0.0–3.0)
Base Excess Blood, ART: 6.6 mEq/L — ABNORMAL HIGH (ref 0.0–3.0)
Bicarbonate: 31 mEq/L — ABNORMAL HIGH (ref 22–26)
Bicarbonate: 32 mEq/L — ABNORMAL HIGH (ref 22–26)
Fi (O2) [%]: 40 %
Fi (O2) [%]: 40 %
pCO2, ART: 48 mm Hg (ref 33–48)
pCO2, ART: 59 mm Hg — ABNORMAL HIGH (ref 33–48)
pH, ART: 7.42 (ref 7.35–7.45)
pH, ART: 7.36 (ref 7.35–7.45)
pO2: 86 mm Hg (ref 80–104)
pO2: 89 mm Hg (ref 80–104)

## 2011-03-14 LAB — CBC (HEMOGRAM)
Hematocrit: 20 % — ABNORMAL LOW (ref 38–50)
Hemoglobin: 6.9 g/dL — ABNORMAL LOW (ref 13.0–18.0)
MCH: 30.9 pg (ref 27.3–33.6)
MCHC: 34.5 g/dL (ref 32.2–36.5)
MCV: 90 fL (ref 81–98)
Platelet Count: 116 10*3/uL — ABNORMAL LOW (ref 150–400)
RBC: 2.23 mil/uL — ABNORMAL LOW (ref 4.40–5.60)
RDW-CV: 13.2 % (ref 11.6–14.4)
WBC: 14.44 10*3/uL — ABNORMAL HIGH (ref 4.30–10.00)

## 2011-03-14 LAB — HEMATOCRIT: Hematocrit: 20 % — ABNORMAL LOW (ref 38–50)

## 2011-03-14 LAB — VANCO-RESIST ENTEROCOCCUS C/S

## 2011-03-14 LAB — CALCIUM, (REFLEXIVE IONIZED)

## 2011-03-14 LAB — R/O ACINETOBACTER CULTURE

## 2011-03-14 LAB — MAGNESIUM
Magnesium: 2.3 mg/dL (ref 1.8–2.4)
Magnesium: 2.3 mg/dL (ref 1.8–2.4)

## 2011-03-14 LAB — PHOSPHATE
Phosphate: 2.5 mg/dL (ref 2.5–4.5)
Phosphate: 2.2 mg/dL — ABNORMAL LOW (ref 2.5–4.5)
Phosphate: 1.9 mg/dL — ABNORMAL LOW (ref 2.5–4.5)

## 2011-03-14 LAB — POTASSIUM, SERUM: Potassium: 4 mEq/L (ref 3.7–5.2)

## 2011-03-14 LAB — R/O MRSA

## 2011-03-14 LAB — IONIZED CALCIUM, PLASMA: Ionized Calcium, Plasma: 1.13 mmol/L — ABNORMAL LOW (ref 1.18–1.38)

## 2011-03-14 LAB — L_LACTATE, ARTERIAL WHOLE BLOOD
L Lactate (Direct), ART WB: 0.9 mmol/L (ref 0.4–1.0)
L Lactate (Direct), ART WB: 0.8 mmol/L (ref 0.4–1.0)

## 2011-03-15 ENCOUNTER — Other Ambulatory Visit (HOSPITAL_COMMUNITY): Payer: Self-pay

## 2011-03-15 LAB — BLOOD GAS, ARTERIAL (NO ELECTROLYTES)
Base Excess Blood, ART: 7.6 mEq/L — ABNORMAL HIGH (ref 0.0–3.0)
Base Excess Blood, ART: 8.9 mEq/L — ABNORMAL HIGH (ref 0.0–3.0)
Bicarbonate: 33 mEq/L — ABNORMAL HIGH (ref 22–26)
Bicarbonate: 33 mEq/L — ABNORMAL HIGH (ref 22–26)
Fi (O2) [%]: 50 %
Fi (O2) [%]: 50 %
pCO2, ART: 59 mm Hg — ABNORMAL HIGH (ref 33–48)
pCO2, ART: 49 mm Hg — ABNORMAL HIGH (ref 33–48)
pH, ART: 7.37 (ref 7.35–7.45)
pH, ART: 7.45 (ref 7.35–7.45)
pO2: 68 mm Hg — ABNORMAL LOW (ref 80–104)
pO2: 90 mm Hg (ref 80–104)

## 2011-03-15 LAB — TYPE AND SCREEN
ABO/Rh: O POS
Antibody Screen: NEGATIVE

## 2011-03-15 LAB — BLOOD GAS PANEL 9, ART
Bicarbonate: 33 mEq/L — ABNORMAL HIGH (ref 22–26)
Calcium (Ionized): 1.08 mmol/L — ABNORMAL LOW (ref 1.18–1.38)
Carboxyhemoglobin, ART: 2.2 %
Glucose: 123 mg/dL (ref 62–125)
Hematocrit: 23 % — ABNORMAL LOW (ref 38–50)
L Lactate (Direct), ART WB: 1 mmol/L (ref 0.4–1.0)
Methemoglobin, ART: 0.8 % (ref <3.0)
O2 Content: 10.2 VOL% — ABNORMAL LOW (ref 15–23)
O2 Sat (Frac.), ART: 96 % (ref 94–99)
Potassium: 3.7 mEq/L (ref 3.7–5.2)
Sodium: 138 mEq/L (ref 136–145)
Total Hemoglobin, ART: 7.4 g/dL — ABNORMAL LOW (ref 13.0–18.0)
pCO2, ART: 47 mm Hg (ref 33–48)
pH, ART: 7.46 — ABNORMAL HIGH (ref 7.35–7.45)
pO2: 129 mm Hg — ABNORMAL HIGH (ref 80–104)

## 2011-03-15 LAB — PROTHROMBIN & PTT
Partial Thromboplastin Time: 27 s (ref 22–35)
Prothrombin INR: 1.1 (ref 0.8–1.3)
Prothrombin Time Patient: 13.6 s (ref 10.7–15.6)

## 2011-03-15 LAB — BMP WITH REFLEXIVE IONIZED CA
Anion Gap: 5 (ref 3–11)
Calcium: 7.7 mg/dL — ABNORMAL LOW (ref 8.9–10.2)
Carbon Dioxide, Total: 32 mEq/L (ref 22–32)
Chloride: 101 mEq/L (ref 98–108)
Creatinine: 0.74 mg/dL (ref 0.51–1.18)
GFR, Calc, African American: >60 mL/min (ref >59)
GFR, Calc, European American: >60 mL/min (ref >59)
Glucose: 140 mg/dL — ABNORMAL HIGH (ref 62–125)
Potassium: 4 mEq/L (ref 3.7–5.2)
Sodium: 138 mEq/L (ref 136–145)
Urea Nitrogen: 12 mg/dL (ref 8–21)

## 2011-03-15 LAB — CBC (HEMOGRAM)
Hematocrit: 21 % — ABNORMAL LOW (ref 38–50)
Hemoglobin: 7.1 g/dL — ABNORMAL LOW (ref 13.0–18.0)
MCH: 31.1 pg (ref 27.3–33.6)
MCHC: 34.1 g/dL (ref 32.2–36.5)
MCV: 91 fL (ref 81–98)
Platelet Count: 192 10*3/uL (ref 150–400)
RBC: 2.28 mil/uL — ABNORMAL LOW (ref 4.40–5.60)
RDW-CV: 13.1 % (ref 11.6–14.4)
WBC: 11.66 10*3/uL — ABNORMAL HIGH (ref 4.30–10.00)

## 2011-03-15 LAB — IONIZED CALCIUM, PLASMA: Ionized Calcium, Plasma: 1.13 mmol/L — ABNORMAL LOW (ref 1.18–1.38)

## 2011-03-15 LAB — L_LACTATE, ARTERIAL WHOLE BLOOD: L Lactate (Direct), ART WB: 1.1 mmol/L — ABNORMAL HIGH (ref 0.4–1.0)

## 2011-03-15 LAB — MAGNESIUM: Magnesium: 2.5 mg/dL — ABNORMAL HIGH (ref 1.8–2.4)

## 2011-03-15 LAB — PHOSPHATE: Phosphate: 2.6 mg/dL (ref 2.5–4.5)

## 2011-03-16 ENCOUNTER — Other Ambulatory Visit (HOSPITAL_COMMUNITY): Payer: Self-pay

## 2011-03-16 LAB — BLOOD GAS, ARTERIAL, W/ ICA
Base Excess Blood, ART: 8.2 mEq/L — ABNORMAL HIGH (ref 0.0–3.0)
Bicarbonate: 32 mEq/L — ABNORMAL HIGH (ref 22–26)
Calcium (Ionized): 1.05 mmol/L — ABNORMAL LOW (ref 1.18–1.38)
Rx (O2) L: 4 L
pCO2, ART: 44 mm Hg (ref 33–48)
pH, ART: 7.48 — ABNORMAL HIGH (ref 7.35–7.45)
pO2: 89 mm Hg (ref 80–104)

## 2011-03-16 LAB — O2 SAT. PANEL, ART
Base Excess Blood, ART: 6.1 mEq/L — ABNORMAL HIGH (ref 0.0–3.0)
Bicarbonate: 30 mEq/L — ABNORMAL HIGH (ref 22–26)
Carboxyhemoglobin, ART: 2 %
Fi (O2) [%]: 100 %
Methemoglobin, ART: 0.6 % (ref <3.0)
O2 Content: 10 VOL% — ABNORMAL LOW (ref 15–23)
O2 Sat (Frac.), ART: 96 % (ref 94–99)
O2 Sat (Func.), ART: 99 %
Total Hemoglobin, ART: 7.3 g/dL — ABNORMAL LOW (ref 13.0–18.0)
pCO2, ART: 39 mm Hg (ref 33–48)
pH, ART: 7.49 — ABNORMAL HIGH (ref 7.35–7.45)
pO2: 100 mm Hg (ref 80–104)

## 2011-03-16 LAB — CBC (HEMOGRAM)
Hematocrit: 20 % — ABNORMAL LOW (ref 38–50)
Hemoglobin: 6.5 g/dL — ABNORMAL LOW (ref 13.0–18.0)
MCH: 30.8 pg (ref 27.3–33.6)
MCHC: 33.2 g/dL (ref 32.2–36.5)
MCV: 93 fL (ref 81–98)
Platelet Count: 299 10*3/uL (ref 150–400)
RBC: 2.11 mil/uL — ABNORMAL LOW (ref 4.40–5.60)
RDW-CV: 13.1 % (ref 11.6–14.4)
WBC: 14.32 10*3/uL — ABNORMAL HIGH (ref 4.30–10.00)

## 2011-03-16 LAB — BASIC METABOLIC PANEL
Anion Gap: 4 (ref 3–11)
Calcium: 7.3 mg/dL — ABNORMAL LOW (ref 8.9–10.2)
Carbon Dioxide, Total: 34 mEq/L — ABNORMAL HIGH (ref 22–32)
Chloride: 104 mEq/L (ref 98–108)
Creatinine: 0.72 mg/dL (ref 0.51–1.18)
GFR, Calc, African American: >60 mL/min (ref >59)
GFR, Calc, European American: >60 mL/min (ref >59)
Glucose: 112 mg/dL (ref 62–125)
Potassium: 3.8 mEq/L (ref 3.7–5.2)
Sodium: 142 mEq/L (ref 136–145)
Urea Nitrogen: 13 mg/dL (ref 8–21)

## 2011-03-16 LAB — MAGNESIUM: Magnesium: 2.5 mg/dL — ABNORMAL HIGH (ref 1.8–2.4)

## 2011-03-16 LAB — PHOSPHATE: Phosphate: 1.9 mg/dL — ABNORMAL LOW (ref 2.5–4.5)

## 2011-03-17 ENCOUNTER — Other Ambulatory Visit (HOSPITAL_COMMUNITY): Payer: Self-pay

## 2011-03-17 LAB — BASIC METABOLIC PANEL
Anion Gap: 6 (ref 3–11)
Calcium: 7.4 mg/dL — ABNORMAL LOW (ref 8.9–10.2)
Carbon Dioxide, Total: 30 mEq/L (ref 22–32)
Chloride: 100 mEq/L (ref 98–108)
Creatinine: 0.79 mg/dL (ref 0.51–1.18)
GFR, Calc, African American: >60 mL/min (ref >59)
GFR, Calc, European American: >60 mL/min (ref >59)
Glucose: 102 mg/dL (ref 62–125)
Potassium: 3.5 mEq/L — ABNORMAL LOW (ref 3.7–5.2)
Sodium: 136 mEq/L (ref 136–145)
Urea Nitrogen: 15 mg/dL (ref 8–21)

## 2011-03-17 LAB — CBC (HEMOGRAM)
Hematocrit: 22 % — ABNORMAL LOW (ref 38–50)
Hemoglobin: 7.1 g/dL — ABNORMAL LOW (ref 13.0–18.0)
MCH: 31 pg (ref 27.3–33.6)
MCHC: 32.6 g/dL (ref 32.2–36.5)
MCV: 95 fL (ref 81–98)
Platelet Count: 324 10*3/uL (ref 150–400)
RBC: 2.29 mil/uL — ABNORMAL LOW (ref 4.40–5.60)
RDW-CV: 13.7 % (ref 11.6–14.4)
WBC: 24.87 10*3/uL — ABNORMAL HIGH (ref 4.30–10.00)

## 2011-03-17 LAB — MAGNESIUM: Magnesium: 2.5 mg/dL — ABNORMAL HIGH (ref 1.8–2.4)

## 2011-03-17 LAB — PHOSPHATE: Phosphate: 2.6 mg/dL (ref 2.5–4.5)

## 2011-03-17 LAB — C. DIFFICILE TOXIN GENE BY PCR: C. difficile Toxin Gene by PCR: NEGATIVE

## 2011-03-18 ENCOUNTER — Other Ambulatory Visit (HOSPITAL_COMMUNITY): Payer: Self-pay

## 2011-03-18 LAB — CBC (HEMOGRAM)
Hematocrit: 23 % — ABNORMAL LOW (ref 38–50)
Hemoglobin: 7.4 g/dL — ABNORMAL LOW (ref 13.0–18.0)
MCH: 30.5 pg (ref 27.3–33.6)
MCHC: 32.7 g/dL (ref 32.2–36.5)
MCV: 93 fL (ref 81–98)
Platelet Count: 417 10*3/uL — ABNORMAL HIGH (ref 150–400)
RBC: 2.43 mil/uL — ABNORMAL LOW (ref 4.40–5.60)
RDW-CV: 13.1 % (ref 11.6–14.4)
WBC: 21.02 10*3/uL — ABNORMAL HIGH (ref 4.30–10.00)

## 2011-03-18 LAB — BASIC METABOLIC PANEL
Anion Gap: 7 (ref 3–11)
Calcium: 7.8 mg/dL — ABNORMAL LOW (ref 8.9–10.2)
Carbon Dioxide, Total: 30 mEq/L (ref 22–32)
Chloride: 97 mEq/L — ABNORMAL LOW (ref 98–108)
Creatinine: 0.67 mg/dL (ref 0.51–1.18)
GFR, Calc, African American: >60 mL/min (ref >59)
GFR, Calc, European American: >60 mL/min (ref >59)
Glucose: 111 mg/dL (ref 62–125)
Potassium: 3.6 mEq/L — ABNORMAL LOW (ref 3.7–5.2)
Sodium: 134 mEq/L — ABNORMAL LOW (ref 136–145)
Urea Nitrogen: 13 mg/dL (ref 8–21)

## 2011-03-18 LAB — URINALYSIS WORKUP
Bilirubin (Qual), URN: NEGATIVE
Glucose Qual, URN: NEGATIVE mg/dL
Ketones, URN: NEGATIVE mg/dL
Leukocyte Esterase, URN: NEGATIVE
Nitrite, URN: NEGATIVE
Specific Gravity, URN: 1.014 g/mL (ref 1.002–1.027)
pH, URN: 6 (ref 5.0–8.0)

## 2011-03-18 LAB — MAGNESIUM: Magnesium: 2.3 mg/dL (ref 1.8–2.4)

## 2011-03-18 LAB — URINALYSIS MICROSCOPIC
Bacteria, URN: NONE SEEN
Epith Cells_Renal/Trans,URN: NEGATIVE /HPF
Epith Cells_Squamous, URN: NEGATIVE /LPF
RBC, URN: NEGATIVE /HPF
WBC, URN: NEGATIVE /HPF

## 2011-03-18 LAB — CALCIUM, (REFLEXIVE IONIZED)

## 2011-03-18 LAB — IONIZED CALCIUM, PLASMA: Ionized Calcium, Plasma: 1.14 mmol/L — ABNORMAL LOW (ref 1.18–1.38)

## 2011-03-18 LAB — PHOSPHATE: Phosphate: 4.1 mg/dL (ref 2.5–4.5)

## 2011-03-19 ENCOUNTER — Other Ambulatory Visit (HOSPITAL_COMMUNITY): Payer: Self-pay

## 2011-03-19 LAB — CBC (HEMOGRAM)
Hematocrit: 22 % — ABNORMAL LOW (ref 38–50)
Hemoglobin: 7.3 g/dL — ABNORMAL LOW (ref 13.0–18.0)
MCH: 30 pg (ref 27.3–33.6)
MCHC: 32.7 g/dL (ref 32.2–36.5)
MCV: 92 fL (ref 81–98)
Platelet Count: 561 10*3/uL — ABNORMAL HIGH (ref 150–400)
RBC: 2.43 mil/uL — ABNORMAL LOW (ref 4.40–5.60)
RDW-CV: 13.2 % (ref 11.6–14.4)
WBC: 27.85 10*3/uL — ABNORMAL HIGH (ref 4.30–10.00)

## 2011-03-19 LAB — C. DIFFICILE TOXIN GENE BY PCR: C. difficile Toxin Gene by PCR: NEGATIVE

## 2011-03-19 LAB — BASIC METABOLIC PANEL
Anion Gap: 9 (ref 3–11)
Calcium: 7.6 mg/dL — ABNORMAL LOW (ref 8.9–10.2)
Carbon Dioxide, Total: 29 mEq/L (ref 22–32)
Chloride: 97 mEq/L — ABNORMAL LOW (ref 98–108)
Creatinine: 0.72 mg/dL (ref 0.51–1.18)
GFR, Calc, African American: >60 mL/min (ref >59)
GFR, Calc, European American: >60 mL/min (ref >59)
Glucose: 129 mg/dL — ABNORMAL HIGH (ref 62–125)
Potassium: 3.6 mEq/L — ABNORMAL LOW (ref 3.7–5.2)
Potassium: ELEVATED
Sodium: 135 mEq/L — ABNORMAL LOW (ref 136–145)
Urea Nitrogen: 10 mg/dL (ref 8–21)

## 2011-03-19 LAB — PATHOLOGY, SURGICAL

## 2011-03-20 ENCOUNTER — Other Ambulatory Visit (HOSPITAL_COMMUNITY): Payer: Self-pay

## 2011-03-20 LAB — BASIC METABOLIC PANEL
Anion Gap: 7 (ref 3–11)
Calcium: 7.4 mg/dL — ABNORMAL LOW (ref 8.9–10.2)
Carbon Dioxide, Total: 30 mEq/L (ref 22–32)
Chloride: 96 mEq/L — ABNORMAL LOW (ref 98–108)
Creatinine: 0.68 mg/dL (ref 0.51–1.18)
GFR, Calc, African American: >60 mL/min (ref >59)
GFR, Calc, European American: >60 mL/min (ref >59)
Glucose: 114 mg/dL (ref 62–125)
Potassium: 4 mEq/L (ref 3.7–5.2)
Potassium: ELEVATED
Sodium: 133 mEq/L — ABNORMAL LOW (ref 136–145)
Urea Nitrogen: 9 mg/dL (ref 8–21)

## 2011-03-20 LAB — URINE C/S W/GRAM
Culture: >=4000
Direct Exam: NONE SEEN

## 2011-03-20 LAB — CBC (HEMOGRAM)
Hematocrit: 23 % — ABNORMAL LOW (ref 38–50)
Hemoglobin: 7.7 g/dL — ABNORMAL LOW (ref 13.0–18.0)
MCH: 30.3 pg (ref 27.3–33.6)
MCHC: 33 g/dL (ref 32.2–36.5)
MCV: 92 fL (ref 81–98)
Platelet Count: 684 10*3/uL — ABNORMAL HIGH (ref 150–400)
RBC: 2.54 mil/uL — ABNORMAL LOW (ref 4.40–5.60)
RDW-CV: 13.2 % (ref 11.6–14.4)
WBC: 40.13 10*3/uL — ABNORMAL HIGH (ref 4.30–10.00)

## 2011-03-20 LAB — ALBUMIN, SERUM: Albumin: 1.7 g/dL — ABNORMAL LOW (ref 3.5–5.2)

## 2011-03-20 LAB — L LACTATE, VENOUS WB (TO U/H LAB WITHIN 30 MIN): L Lactate (Direct), Venous Whole Blood: 1.3 mmol/L (ref 0.6–2.5)

## 2011-03-20 LAB — C_REACTIVE PROTEIN: C_Reactive Protein: 253.3 mg/L — ABNORMAL HIGH (ref 0.0–10.0)

## 2011-03-20 LAB — TRANSTHYRETIN (PRE-ALBUMIN): Transthyretin (Pre_Albumin): 6.9 mg/dL — ABNORMAL LOW (ref 19.0–45.0)

## 2011-03-21 ENCOUNTER — Other Ambulatory Visit (HOSPITAL_COMMUNITY): Payer: Self-pay

## 2011-03-21 LAB — BASIC METABOLIC PANEL
Anion Gap: 7 (ref 3–11)
Calcium: 7.3 mg/dL — ABNORMAL LOW (ref 8.9–10.2)
Carbon Dioxide, Total: 27 mEq/L (ref 22–32)
Chloride: 97 mEq/L — ABNORMAL LOW (ref 98–108)
Creatinine: 0.66 mg/dL (ref 0.51–1.18)
GFR, Calc, African American: >60 mL/min (ref >59)
GFR, Calc, European American: >60 mL/min (ref >59)
Glucose: 122 mg/dL (ref 62–125)
Potassium: 3.9 mEq/L (ref 3.7–5.2)
Potassium: ELEVATED
Sodium: 131 mEq/L — ABNORMAL LOW (ref 136–145)
Urea Nitrogen: 7 mg/dL — ABNORMAL LOW (ref 8–21)

## 2011-03-21 LAB — CBC (HEMOGRAM)
Hematocrit: 21 % — ABNORMAL LOW (ref 38–50)
Hemoglobin: 6.9 g/dL — ABNORMAL LOW (ref 13.0–18.0)
MCH: 29.7 pg (ref 27.3–33.6)
MCHC: 32.4 g/dL (ref 32.2–36.5)
MCV: 92 fL (ref 81–98)
Platelet Count: 778 10*3/uL — ABNORMAL HIGH (ref 150–400)
RBC: 2.32 mil/uL — ABNORMAL LOW (ref 4.40–5.60)
RDW-CV: 13.6 % (ref 11.6–14.4)
WBC: 42.98 10*3/uL — ABNORMAL HIGH (ref 4.30–10.00)

## 2011-03-22 ENCOUNTER — Other Ambulatory Visit (HOSPITAL_COMMUNITY): Payer: Self-pay

## 2011-03-22 LAB — BASIC METABOLIC PANEL
Anion Gap: 7 (ref 3–11)
Calcium: 7.5 mg/dL — ABNORMAL LOW (ref 8.9–10.2)
Carbon Dioxide, Total: 25 mEq/L (ref 22–32)
Chloride: 101 mEq/L (ref 98–108)
Creatinine: 0.67 mg/dL (ref 0.51–1.18)
GFR, Calc, African American: >60 mL/min (ref >59)
GFR, Calc, European American: >60 mL/min (ref >59)
Glucose: 121 mg/dL (ref 62–125)
Potassium: 4.3 mEq/L (ref 3.7–5.2)
Potassium: ELEVATED
Sodium: 133 mEq/L — ABNORMAL LOW (ref 136–145)
Urea Nitrogen: 7 mg/dL — ABNORMAL LOW (ref 8–21)

## 2011-03-22 LAB — CBC, DIFF
% Basophils: 0 % (ref 0–1)
% Eosinophils: 0 % (ref 0–7)
% Immature Granulocytes: 0 % (ref 0–1)
% Lymphocytes: 5 % — ABNORMAL LOW (ref 19–53)
% Metamyelocytes: 2 % — ABNORMAL HIGH
% Monocytes: 6 % (ref 5–13)
% Neutrophils: 87 % — ABNORMAL HIGH (ref 34–71)
% Nucleated RBC: 1 % — ABNORMAL HIGH
Absolute Eosinophil Count: 0 10*3/uL (ref 0.00–0.50)
Absolute Lymphocyte Count: 2.13 10*3/uL (ref 1.00–4.80)
Basophils: 0 10*3/uL (ref 0.00–0.20)
Hematocrit: 23 % — ABNORMAL LOW (ref 38–50)
Hemoglobin: 7.5 g/dL — ABNORMAL LOW (ref 13.0–18.0)
Immature Granulocytes: 0 10*3/uL (ref 0.00–0.05)
MCH: 31 pg (ref 27.3–33.6)
MCHC: 33.3 g/dL (ref 32.2–36.5)
MCV: 93 fL (ref 81–98)
Metamyelocytes: 0.85 10*3/uL — ABNORMAL HIGH (ref 0–0)
Monocytes: 2.56 10*3/uL — ABNORMAL HIGH (ref 0.00–0.80)
Neutrophils: 37.05 10*3/uL — ABNORMAL HIGH (ref 1.80–7.00)
Nucleated RBC: 0.43 10*3/uL — ABNORMAL HIGH
Platelet Count: 751 10*3/uL — ABNORMAL HIGH (ref 150–400)
RBC: 2.42 mil/uL — ABNORMAL LOW (ref 4.40–5.60)
RDW-CV: 13.3 % (ref 11.6–14.4)
WBC: 42.59 10*3/uL — ABNORMAL HIGH (ref 4.30–10.00)

## 2011-03-22 LAB — BLOOD GAS PANEL 9, ART
Bicarbonate: 20 meq/L — ABNORMAL LOW (ref 24–31)
Calcium (Ionized): 1.01 mmol/L — ABNORMAL LOW (ref 1.18–1.38)
Carboxyhemoglobin, ART: 1.3 %
Glucose: 128 mg/dL — ABNORMAL HIGH (ref 62–125)
Hematocrit: 29 % — ABNORMAL LOW (ref 38–50)
L Lactate (Direct), ART WB: 5.9 mmol/L — ABNORMAL HIGH (ref 0.4–1.0)
Methemoglobin, ART: 1.1 % (ref <3.0)
O2 Content: 13.2 %{vol} — ABNORMAL LOW (ref 15–23)
O2 Sat (Frac.), ART: 97 % (ref 94–99)
Potassium: 4.4 meq/L (ref 3.7–5.2)
Sodium: 139 meq/L (ref 136–145)
Total Hemoglobin, ART: 9.4 g/dL — ABNORMAL LOW (ref 13.0–18.0)
pCO2, ART: 35 mmHg (ref 33–48)
pH, ART: 7.37 (ref 7.35–7.45)
pO2: 188 mmHg — ABNORMAL HIGH (ref 80–104)

## 2011-03-22 LAB — CBC (HEMOGRAM)
Hematocrit: 22 % — ABNORMAL LOW (ref 38–50)
Hemoglobin: 7.1 g/dL — ABNORMAL LOW (ref 13.0–18.0)
MCH: 30.2 pg (ref 27.3–33.6)
MCHC: 32.4 g/dL (ref 32.2–36.5)
MCV: 93 fL (ref 81–98)
Platelet Count: 913 10*3/uL — ABNORMAL HIGH (ref 150–400)
RBC: 2.35 mil/uL — ABNORMAL LOW (ref 4.40–5.60)
RDW-CV: 14.2 % (ref 11.6–14.4)
WBC: 40.36 10*3/uL — ABNORMAL HIGH (ref 4.30–10.00)

## 2011-03-23 ENCOUNTER — Other Ambulatory Visit (HOSPITAL_COMMUNITY): Payer: Self-pay

## 2011-03-23 LAB — BLOOD C/S
Culture: NO GROWTH
Culture: NO GROWTH

## 2011-03-23 LAB — CBC (HEMOGRAM)
Hematocrit: 23 % — ABNORMAL LOW (ref 38–50)
Hemoglobin: 7.4 g/dL — ABNORMAL LOW (ref 13.0–18.0)
MCH: 30.2 pg (ref 27.3–33.6)
MCHC: 31.9 g/dL — ABNORMAL LOW (ref 32.2–36.5)
MCV: 95 fL (ref 81–98)
Platelet Count: 996 10*3/uL — ABNORMAL HIGH (ref 150–400)
RBC: 2.45 mil/uL — ABNORMAL LOW (ref 4.40–5.60)
RDW-CV: 14.5 % — ABNORMAL HIGH (ref 11.6–14.4)
WBC: 34.55 10*3/uL — ABNORMAL HIGH (ref 4.30–10.00)

## 2011-03-23 LAB — WOUND C/S W/GRAM (ANAEROBIC)

## 2011-03-23 LAB — BASIC METABOLIC PANEL
Anion Gap: 5 (ref 3–11)
Calcium: 7.7 mg/dL — ABNORMAL LOW (ref 8.9–10.2)
Carbon Dioxide, Total: 26 mEq/L (ref 22–32)
Chloride: 100 mEq/L (ref 98–108)
Creatinine: 0.67 mg/dL (ref 0.51–1.18)
GFR, Calc, African American: >60 mL/min (ref >59)
GFR, Calc, European American: >60 mL/min (ref >59)
Glucose: 115 mg/dL (ref 62–125)
Potassium: 4.6 mEq/L (ref 3.7–5.2)
Potassium: ELEVATED
Sodium: 131 mEq/L — ABNORMAL LOW (ref 136–145)
Urea Nitrogen: 9 mg/dL (ref 8–21)

## 2011-03-23 LAB — IONIZED CALCIUM, PLASMA: Ionized Calcium, Plasma: 1.16 mmol/L — ABNORMAL LOW (ref 1.18–1.38)

## 2011-03-23 LAB — CALCIUM, (REFLEXIVE IONIZED)

## 2011-03-24 ENCOUNTER — Other Ambulatory Visit (HOSPITAL_COMMUNITY): Payer: Self-pay

## 2011-03-24 LAB — BASIC METABOLIC PANEL
Anion Gap: 8 (ref 3–11)
Calcium: 8.2 mg/dL — ABNORMAL LOW (ref 8.9–10.2)
Carbon Dioxide, Total: 25 mEq/L (ref 22–32)
Chloride: 100 mEq/L (ref 98–108)
Creatinine: 0.67 mg/dL (ref 0.51–1.18)
GFR, Calc, African American: >60 mL/min (ref >59)
GFR, Calc, European American: >60 mL/min (ref >59)
Glucose: 117 mg/dL (ref 62–125)
Potassium: 4.7 mEq/L (ref 3.7–5.2)
Potassium: ELEVATED
Sodium: 133 mEq/L — ABNORMAL LOW (ref 136–145)
Urea Nitrogen: 9 mg/dL (ref 8–21)

## 2011-03-24 LAB — WOUND C/S W/GRAM (ANAEROBIC)

## 2011-03-24 LAB — CBC (HEMOGRAM)
Hematocrit: 25 % — ABNORMAL LOW (ref 38–50)
Hemoglobin: 7.8 g/dL — ABNORMAL LOW (ref 13.0–18.0)
MCH: 29.9 pg (ref 27.3–33.6)
MCHC: 31.3 g/dL — ABNORMAL LOW (ref 32.2–36.5)
MCV: 95 fL (ref 81–98)
Platelet Count: 1283 10*3/uL — ABNORMAL HIGH (ref 150–400)
RBC: 2.61 mil/uL — ABNORMAL LOW (ref 4.40–5.60)
RDW-CV: 15.4 % — ABNORMAL HIGH (ref 11.6–14.4)
WBC: 30.63 10*3/uL — ABNORMAL HIGH (ref 4.30–10.00)

## 2011-03-24 LAB — PHOSPHATE: Phosphate: 4.5 mg/dL (ref 2.5–4.5)

## 2011-03-24 LAB — MAGNESIUM: Magnesium: 2.3 mg/dL (ref 1.8–2.4)

## 2011-03-24 LAB — HEMATOCRIT
Hematocrit: 20 % — ABNORMAL LOW (ref 38–50)
Hematocrit: 21 % — ABNORMAL LOW (ref 38–50)

## 2011-03-25 ENCOUNTER — Other Ambulatory Visit (HOSPITAL_COMMUNITY): Payer: Self-pay

## 2011-03-25 LAB — BASIC METABOLIC PANEL
Anion Gap: 7 (ref 3–11)
Calcium: 7.3 mg/dL — ABNORMAL LOW (ref 8.9–10.2)
Carbon Dioxide, Total: 25 mEq/L (ref 22–32)
Chloride: 99 mEq/L (ref 98–108)
Creatinine: 0.5 mg/dL — ABNORMAL LOW (ref 0.51–1.18)
GFR, Calc, African American: >60 mL/min (ref >59)
GFR, Calc, European American: >60 mL/min (ref >59)
Glucose: 125 mg/dL (ref 62–125)
Potassium: 4 mEq/L (ref 3.7–5.2)
Potassium: ELEVATED
Sodium: 131 mEq/L — ABNORMAL LOW (ref 136–145)
Urea Nitrogen: 6 mg/dL — ABNORMAL LOW (ref 8–21)

## 2011-03-25 LAB — PHOSPHATE

## 2011-03-25 LAB — CALCIUM, (REFLEXIVE IONIZED)

## 2011-03-25 LAB — CBC (HEMOGRAM)
Hematocrit: 26 % — ABNORMAL LOW (ref 38–50)
Hemoglobin: 8.4 g/dL — ABNORMAL LOW (ref 13.0–18.0)
MCH: 29.4 pg (ref 27.3–33.6)
MCHC: 32.2 g/dL (ref 32.2–36.5)
MCV: 91 fL (ref 81–98)
Platelet Count: 1001 10*3/uL — ABNORMAL HIGH (ref 150–400)
RBC: 2.86 mil/uL — ABNORMAL LOW (ref 4.40–5.60)
RDW-CV: 16.8 % — ABNORMAL HIGH (ref 11.6–14.4)
WBC: 31.46 10*3/uL — ABNORMAL HIGH (ref 4.30–10.00)

## 2011-03-25 LAB — MAGNESIUM

## 2011-03-26 ENCOUNTER — Other Ambulatory Visit (HOSPITAL_COMMUNITY): Payer: Self-pay

## 2011-03-26 LAB — CBC (HEMOGRAM)
Hematocrit: 28 % — ABNORMAL LOW (ref 38–50)
Hemoglobin: 8.9 g/dL — ABNORMAL LOW (ref 13.0–18.0)
MCH: 29.3 pg (ref 27.3–33.6)
MCHC: 31.6 g/dL — ABNORMAL LOW (ref 32.2–36.5)
MCV: 93 fL (ref 81–98)
Platelet Count: 1370 10*3/uL — ABNORMAL HIGH (ref 150–400)
RBC: 3.04 mil/uL — ABNORMAL LOW (ref 4.40–5.60)
RDW-CV: 16.8 % — ABNORMAL HIGH (ref 11.6–14.4)
WBC: 26.5 10*3/uL — ABNORMAL HIGH (ref 4.30–10.00)

## 2011-03-26 LAB — TYPE AND SCREEN
ABO/Rh: O POS
Antibody Screen: NEGATIVE
Units Ordered: 2

## 2011-03-26 LAB — BASIC METABOLIC PANEL
Anion Gap: 9 (ref 3–11)
Calcium: 8 mg/dL — ABNORMAL LOW (ref 8.9–10.2)
Carbon Dioxide, Total: 26 mEq/L (ref 22–32)
Chloride: 99 mEq/L (ref 98–108)
Creatinine: 0.61 mg/dL (ref 0.51–1.18)
GFR, Calc, African American: >60 mL/min (ref >59)
GFR, Calc, European American: >60 mL/min (ref >59)
Glucose: 141 mg/dL — ABNORMAL HIGH (ref 62–125)
Potassium: 4.4 mEq/L (ref 3.7–5.2)
Potassium: ELEVATED
Sodium: 134 mEq/L — ABNORMAL LOW (ref 136–145)
Urea Nitrogen: 5 mg/dL — ABNORMAL LOW (ref 8–21)

## 2011-03-27 ENCOUNTER — Other Ambulatory Visit (HOSPITAL_COMMUNITY): Payer: Self-pay

## 2011-03-27 LAB — CBC (HEMOGRAM)
Hematocrit: 28 % — ABNORMAL LOW (ref 38–50)
Hematocrit: 28 % — ABNORMAL LOW (ref 38–50)
Hemoglobin: 8.7 g/dL — ABNORMAL LOW (ref 13.0–18.0)
Hemoglobin: 8.9 g/dL — ABNORMAL LOW (ref 13.0–18.0)
MCH: 29.2 pg (ref 27.3–33.6)
MCH: 29.6 pg (ref 27.3–33.6)
MCHC: 31.2 g/dL — ABNORMAL LOW (ref 32.2–36.5)
MCHC: 31.4 g/dL — ABNORMAL LOW (ref 32.2–36.5)
MCV: 94 fL (ref 81–98)
MCV: 94 fL (ref 81–98)
Platelet Count: 1537 10*3/uL — ABNORMAL HIGH (ref 150–400)
Platelet Count: 1472 10*3/uL — ABNORMAL HIGH (ref 150–400)
RBC: 2.98 mil/uL — ABNORMAL LOW (ref 4.40–5.60)
RBC: 3.01 mil/uL — ABNORMAL LOW (ref 4.40–5.60)
RDW-CV: 16.5 % — ABNORMAL HIGH (ref 11.6–14.4)
RDW-CV: 16.1 % — ABNORMAL HIGH (ref 11.6–14.4)
WBC: 17.55 10*3/uL — ABNORMAL HIGH (ref 4.30–10.00)
WBC: 20.87 10*3/uL — ABNORMAL HIGH (ref 4.30–10.00)

## 2011-03-27 LAB — BASIC METABOLIC PANEL
Anion Gap: 8 (ref 3–11)
Calcium: 8.3 mg/dL — ABNORMAL LOW (ref 8.9–10.2)
Carbon Dioxide, Total: 28 mEq/L (ref 22–32)
Chloride: 99 mEq/L (ref 98–108)
Creatinine: 0.54 mg/dL (ref 0.51–1.18)
GFR, Calc, African American: >60 mL/min (ref >59)
GFR, Calc, European American: >60 mL/min (ref >59)
Glucose: 115 mg/dL (ref 62–125)
Potassium: 4.5 mEq/L (ref 3.7–5.2)
Potassium: ELEVATED
Sodium: 135 mEq/L — ABNORMAL LOW (ref 136–145)
Urea Nitrogen: 6 mg/dL — ABNORMAL LOW (ref 8–21)

## 2011-03-28 ENCOUNTER — Other Ambulatory Visit (HOSPITAL_COMMUNITY): Payer: Self-pay

## 2011-03-28 LAB — BASIC METABOLIC PANEL
Anion Gap: 7 (ref 3–11)
Anion Gap: 8 (ref 3–11)
Calcium: 7.9 mg/dL — ABNORMAL LOW (ref 8.9–10.2)
Calcium: 7.9 mg/dL — ABNORMAL LOW (ref 8.9–10.2)
Carbon Dioxide, Total: 29 mEq/L (ref 22–32)
Carbon Dioxide, Total: 27 mEq/L (ref 22–32)
Chloride: 100 mEq/L (ref 98–108)
Chloride: 97 mEq/L — ABNORMAL LOW (ref 98–108)
Creatinine: 0.58 mg/dL (ref 0.51–1.18)
Creatinine: 0.51 mg/dL (ref 0.51–1.18)
GFR, Calc, African American: >60 mL/min (ref >59)
GFR, Calc, African American: >60 mL/min (ref >59)
GFR, Calc, European American: >60 mL/min (ref >59)
GFR, Calc, European American: >60 mL/min (ref >59)
Glucose: 120 mg/dL (ref 62–125)
Glucose: 129 mg/dL — ABNORMAL HIGH (ref 62–125)
Potassium: 4.6 mEq/L (ref 3.7–5.2)
Potassium: ELEVATED
Potassium: 4.4 mEq/L (ref 3.7–5.2)
Potassium: ELEVATED
Sodium: 136 mEq/L (ref 136–145)
Sodium: 132 mEq/L — ABNORMAL LOW (ref 136–145)
Urea Nitrogen: 6 mg/dL — ABNORMAL LOW (ref 8–21)
Urea Nitrogen: 5 mg/dL — ABNORMAL LOW (ref 8–21)

## 2011-03-28 LAB — CBC (HEMOGRAM)
Hematocrit: 27 % — ABNORMAL LOW (ref 38–50)
Hemoglobin: 8.7 g/dL — ABNORMAL LOW (ref 13.0–18.0)
MCH: 29.5 pg (ref 27.3–33.6)
MCHC: 31.8 g/dL — ABNORMAL LOW (ref 32.2–36.5)
MCV: 93 fL (ref 81–98)
Platelet Count: 1539 10*3/uL — ABNORMAL HIGH (ref 150–400)
RBC: 2.95 mil/uL — ABNORMAL LOW (ref 4.40–5.60)
RDW-CV: 16.1 % — ABNORMAL HIGH (ref 11.6–14.4)
WBC: 16.06 10*3/uL — ABNORMAL HIGH (ref 4.30–10.00)

## 2011-03-28 LAB — PHOSPHATE: Phosphate: 3.8 mg/dL (ref 2.5–4.5)

## 2011-03-28 LAB — IONIZED CALCIUM, PLASMA: Ionized Calcium, Plasma: 1.1 mmol/L — ABNORMAL LOW (ref 1.18–1.38)

## 2011-03-28 LAB — CALCIUM, (REFLEXIVE IONIZED)

## 2011-03-28 LAB — MAGNESIUM: Magnesium: 2.1 mg/dL (ref 1.8–2.4)

## 2011-03-29 ENCOUNTER — Other Ambulatory Visit (HOSPITAL_COMMUNITY): Payer: Self-pay

## 2011-03-29 LAB — CBC (HEMOGRAM)
Hematocrit: 29 % — ABNORMAL LOW (ref 38–50)
Hemoglobin: 9 g/dL — ABNORMAL LOW (ref 13.0–18.0)
MCH: 29.1 pg (ref 27.3–33.6)
MCHC: 31.1 g/dL — ABNORMAL LOW (ref 32.2–36.5)
MCV: 94 fL (ref 81–98)
Platelet Count: 1434 10*3/uL — ABNORMAL HIGH (ref 150–400)
RBC: 3.09 mil/uL — ABNORMAL LOW (ref 4.40–5.60)
RDW-CV: 15.7 % — ABNORMAL HIGH (ref 11.6–14.4)
WBC: 20.47 10*3/uL — ABNORMAL HIGH (ref 4.30–10.00)

## 2011-03-29 LAB — DIFF/SMEAR EVALUATION ADD
% Basophils: 0 % (ref 0–1)
% Eosinophils: 1 % (ref 0–7)
% Immature Granulocytes: 2 % — ABNORMAL HIGH (ref 0–1)
% Lymphocytes: 13 % — ABNORMAL LOW (ref 19–53)
% Monocytes: 17 % — ABNORMAL HIGH (ref 5–13)
% Neutrophils: 67 % (ref 34–71)
% Nucleated RBC: 0 %
Absolute Eosinophil Count: 0.13 10*3/uL (ref 0.00–0.50)
Absolute Lymphocyte Count: 2.58 10*3/uL (ref 1.00–4.80)
Basophils: 0.05 10*3/uL (ref 0.00–0.20)
Immature Granulocytes: 0.3 10*3/uL — ABNORMAL HIGH (ref 0.00–0.05)
Monocytes: 3.39 10*3/uL — ABNORMAL HIGH (ref 0.00–0.80)
Neutrophils: 13.49 10*3/uL — ABNORMAL HIGH (ref 1.80–7.00)
Nucleated RBC: 0 10*3/uL
WBC Morphology: INCREASED

## 2011-03-29 LAB — IONIZED CALCIUM, PLASMA: Ionized Calcium, Plasma: 1.14 mmol/L — ABNORMAL LOW (ref 1.18–1.38)

## 2011-03-29 LAB — BASIC METABOLIC PANEL
Anion Gap: 9 (ref 3–11)
Calcium: 7.9 mg/dL — ABNORMAL LOW (ref 8.9–10.2)
Carbon Dioxide, Total: 29 mEq/L (ref 22–32)
Chloride: 98 mEq/L (ref 98–108)
Creatinine: 0.5 mg/dL — ABNORMAL LOW (ref 0.51–1.18)
GFR, Calc, African American: >60 mL/min (ref >59)
GFR, Calc, European American: >60 mL/min (ref >59)
Glucose: 146 mg/dL — ABNORMAL HIGH (ref 62–125)
Potassium: 3.9 mEq/L (ref 3.7–5.2)
Potassium: ELEVATED
Sodium: 136 mEq/L (ref 136–145)
Urea Nitrogen: 4 mg/dL — ABNORMAL LOW (ref 8–21)

## 2011-03-29 LAB — MAGNESIUM: Magnesium: 2 mg/dL (ref 1.8–2.4)

## 2011-03-29 LAB — PHOSPHATE: Phosphate: 3.8 mg/dL (ref 2.5–4.5)

## 2011-03-29 LAB — CALCIUM, (REFLEXIVE IONIZED)

## 2011-03-30 ENCOUNTER — Other Ambulatory Visit (HOSPITAL_COMMUNITY): Payer: Self-pay

## 2011-03-30 LAB — CBC, DIFF
% Basophils: 0 % (ref 0–1)
% Eosinophils: 1 % (ref 0–7)
% Immature Granulocytes: 1 % (ref 0–1)
% Lymphocytes: 14 % — ABNORMAL LOW (ref 19–53)
% Monocytes: 16 % — ABNORMAL HIGH (ref 5–13)
% Neutrophils: 68 % (ref 34–71)
% Nucleated RBC: 0 %
Absolute Eosinophil Count: 0.22 10*3/uL (ref 0.00–0.50)
Absolute Lymphocyte Count: 2.21 10*3/uL (ref 1.00–4.80)
Basophils: 0.04 10*3/uL (ref 0.00–0.20)
Hematocrit: 26 % — ABNORMAL LOW (ref 38–50)
Hemoglobin: 8.3 g/dL — ABNORMAL LOW (ref 13.0–18.0)
Immature Granulocytes: 0.13 10*3/uL — ABNORMAL HIGH (ref 0.00–0.05)
MCH: 30 pg (ref 27.3–33.6)
MCHC: 32.2 g/dL (ref 32.2–36.5)
MCV: 93 fL (ref 81–98)
Monocytes: 2.61 10*3/uL — ABNORMAL HIGH (ref 0.00–0.80)
Neutrophils: 10.86 10*3/uL — ABNORMAL HIGH (ref 1.80–7.00)
Nucleated RBC: 0 10*3/uL
Platelet Count: 1430 10*3/uL — ABNORMAL HIGH (ref 150–400)
RBC: 2.77 mil/uL — ABNORMAL LOW (ref 4.40–5.60)
RDW-CV: 15.4 % — ABNORMAL HIGH (ref 11.6–14.4)
WBC: 15.94 10*3/uL — ABNORMAL HIGH (ref 4.30–10.00)

## 2011-03-30 LAB — VANCOMYCIN, TROUGH LEVEL: Vancomycin, Trough Level: 3.9 ug/mL — ABNORMAL LOW (ref 5.0–20.0)

## 2011-03-31 ENCOUNTER — Other Ambulatory Visit (HOSPITAL_COMMUNITY): Payer: Self-pay

## 2011-03-31 LAB — CBC, DIFF
% Basophils: 0 % (ref 0–1)
% Eosinophils: 3 % (ref 0–7)
% Immature Granulocytes: 1 % (ref 0–1)
% Lymphocytes: 18 % — ABNORMAL LOW (ref 19–53)
% Monocytes: 20 % — ABNORMAL HIGH (ref 5–13)
% Neutrophils: 58 % (ref 34–71)
% Nucleated RBC: 0 %
Absolute Eosinophil Count: 0.37 10*3/uL (ref 0.00–0.50)
Absolute Lymphocyte Count: 2.12 10*3/uL (ref 1.00–4.80)
Basophils: 0.05 10*3/uL (ref 0.00–0.20)
Hematocrit: 29 % — ABNORMAL LOW (ref 38–50)
Hemoglobin: 9.1 g/dL — ABNORMAL LOW (ref 13.0–18.0)
Immature Granulocytes: 0.1 10*3/uL — ABNORMAL HIGH (ref 0.00–0.05)
MCH: 28.5 pg (ref 27.3–33.6)
MCHC: 31 g/dL — ABNORMAL LOW (ref 32.2–36.5)
MCV: 92 fL (ref 81–98)
Monocytes: 2.28 10*3/uL — ABNORMAL HIGH (ref 0.00–0.80)
Neutrophils: 6.77 10*3/uL (ref 1.80–7.00)
Nucleated RBC: 0 10*3/uL
Platelet Count: 1436 10*3/uL — ABNORMAL HIGH (ref 150–400)
RBC: 3.19 mil/uL — ABNORMAL LOW (ref 4.40–5.60)
RDW-CV: 15.1 % — ABNORMAL HIGH (ref 11.6–14.4)
WBC: 11.69 10*3/uL — ABNORMAL HIGH (ref 4.30–10.00)

## 2011-03-31 LAB — BASIC METABOLIC PANEL
Anion Gap: 8 (ref 3–11)
Calcium: 8.5 mg/dL — ABNORMAL LOW (ref 8.9–10.2)
Carbon Dioxide, Total: 28 mEq/L (ref 22–32)
Chloride: 100 mEq/L (ref 98–108)
Creatinine: 0.6 mg/dL (ref 0.51–1.18)
GFR, Calc, African American: >60 mL/min (ref >59)
GFR, Calc, European American: >60 mL/min (ref >59)
Glucose: 110 mg/dL (ref 62–125)
Potassium: 4.5 mEq/L (ref 3.7–5.2)
Potassium: ELEVATED
Sodium: 136 mEq/L (ref 136–145)
Urea Nitrogen: 7 mg/dL — ABNORMAL LOW (ref 8–21)

## 2011-03-31 LAB — MAGNESIUM: Magnesium: 2.2 mg/dL (ref 1.8–2.4)

## 2011-03-31 LAB — PHOSPHATE: Phosphate: 5.3 mg/dL — ABNORMAL HIGH (ref 2.5–4.5)

## 2011-04-01 ENCOUNTER — Other Ambulatory Visit (HOSPITAL_COMMUNITY): Payer: Self-pay

## 2011-04-01 ENCOUNTER — Other Ambulatory Visit (HOSPITAL_COMMUNITY): Payer: Self-pay | Admitting: Orthopaedic Surgery

## 2011-04-01 LAB — TYPE AND CROSSMATCH
ABO/Rh: O POS
Antibody Screen: NEGATIVE
Units Ordered: 2

## 2011-04-01 LAB — BASIC METABOLIC PANEL
Anion Gap: 6 (ref 3–11)
Anion Gap: 12 — ABNORMAL HIGH (ref 3–11)
Calcium: 8.5 mg/dL — ABNORMAL LOW (ref 8.9–10.2)
Calcium: 8 mg/dL — ABNORMAL LOW (ref 8.9–10.2)
Carbon Dioxide, Total: 29 mEq/L (ref 22–32)
Carbon Dioxide, Total: 22 mEq/L (ref 22–32)
Chloride: 99 mEq/L (ref 98–108)
Chloride: 97 mEq/L — ABNORMAL LOW (ref 98–108)
Creatinine: 0.64 mg/dL (ref 0.51–1.18)
Creatinine: 0.67 mg/dL (ref 0.51–1.18)
GFR, Calc, African American: >60 mL/min (ref >59)
GFR, Calc, African American: >60 mL/min (ref >59)
GFR, Calc, European American: >60 mL/min (ref >59)
GFR, Calc, European American: >60 mL/min (ref >59)
Glucose: 102 mg/dL (ref 62–125)
Glucose: 135 mg/dL — ABNORMAL HIGH (ref 62–125)
Potassium: 4.5 mEq/L (ref 3.7–5.2)
Potassium: 4.9 mEq/L (ref 3.7–5.2)
Potassium: ELEVATED
Sodium: 134 mEq/L — ABNORMAL LOW (ref 136–145)
Sodium: 131 mEq/L — ABNORMAL LOW (ref 136–145)
Urea Nitrogen: 6 mg/dL — ABNORMAL LOW (ref 8–21)
Urea Nitrogen: 7 mg/dL — ABNORMAL LOW (ref 8–21)

## 2011-04-01 LAB — CBC (HEMOGRAM)
Hematocrit: 29 % — ABNORMAL LOW (ref 38–50)
Hemoglobin: 9.1 g/dL — ABNORMAL LOW (ref 13.0–18.0)
MCH: 28.3 pg (ref 27.3–33.6)
MCHC: 31.1 g/dL — ABNORMAL LOW (ref 32.2–36.5)
MCV: 91 fL (ref 81–98)
Platelet Count: 1343 10*3/uL — ABNORMAL HIGH (ref 150–400)
RBC: 3.21 mil/uL — ABNORMAL LOW (ref 4.40–5.60)
RDW-CV: 14.9 % — ABNORMAL HIGH (ref 11.6–14.4)
WBC: 12.27 10*3/uL — ABNORMAL HIGH (ref 4.30–10.00)

## 2011-04-01 LAB — CBC, DIFF
% Basophils: 1 % (ref 0–1)
% Eosinophils: 3 % (ref 0–7)
% Immature Granulocytes: 1 % (ref 0–1)
% Lymphocytes: 23 % (ref 19–53)
% Monocytes: 18 % — ABNORMAL HIGH (ref 5–13)
% Neutrophils: 54 % (ref 34–71)
% Nucleated RBC: 0 %
Absolute Eosinophil Count: 0.39 10*3/uL (ref 0.00–0.50)
Absolute Lymphocyte Count: 2.67 10*3/uL (ref 1.00–4.80)
Basophils: 0.06 10*3/uL (ref 0.00–0.20)
Hematocrit: 29 % — ABNORMAL LOW (ref 38–50)
Hemoglobin: 8.9 g/dL — ABNORMAL LOW (ref 13.0–18.0)
Immature Granulocytes: 0.13 10*3/uL — ABNORMAL HIGH (ref 0.00–0.05)
MCH: 28.7 pg (ref 27.3–33.6)
MCHC: 31.2 g/dL — ABNORMAL LOW (ref 32.2–36.5)
MCV: 92 fL (ref 81–98)
Monocytes: 2.12 10*3/uL — ABNORMAL HIGH (ref 0.00–0.80)
Neutrophils: 6.6 10*3/uL (ref 1.80–7.00)
Nucleated RBC: 0 10*3/uL
Platelet Count: 1513 10*3/uL — ABNORMAL HIGH (ref 150–400)
RBC: 3.1 mil/uL — ABNORMAL LOW (ref 4.40–5.60)
RDW-CV: 15.2 % — ABNORMAL HIGH (ref 11.6–14.4)
WBC: 11.84 10*3/uL — ABNORMAL HIGH (ref 4.30–10.00)

## 2011-04-01 LAB — VANCOMYCIN, TROUGH LEVEL: Vancomycin, Trough Level: 12 ug/mL (ref 5.0–20.0)

## 2011-04-01 LAB — TRANSFUSION SERVICES TESTING

## 2011-04-02 ENCOUNTER — Other Ambulatory Visit (HOSPITAL_COMMUNITY): Payer: Self-pay

## 2011-04-02 LAB — CBC, DIFF
% Basophils: 0 % (ref 0–1)
% Eosinophils: 1 % (ref 0–7)
% Immature Granulocytes: 1 % (ref 0–1)
% Lymphocytes: 16 % — ABNORMAL LOW (ref 19–53)
% Monocytes: 18 % — ABNORMAL HIGH (ref 5–13)
% Neutrophils: 64 % (ref 34–71)
% Nucleated RBC: 0 %
Absolute Eosinophil Count: 0.19 10*3/uL (ref 0.00–0.50)
Absolute Lymphocyte Count: 2.22 10*3/uL (ref 1.00–4.80)
Basophils: 0.05 10*3/uL (ref 0.00–0.20)
Hematocrit: 27 % — ABNORMAL LOW (ref 38–50)
Hemoglobin: 8.5 g/dL — ABNORMAL LOW (ref 13.0–18.0)
Immature Granulocytes: 0.11 10*3/uL — ABNORMAL HIGH (ref 0.00–0.05)
MCH: 28.9 pg (ref 27.3–33.6)
MCHC: 31.6 g/dL — ABNORMAL LOW (ref 32.2–36.5)
MCV: 92 fL (ref 81–98)
Monocytes: 2.51 10*3/uL — ABNORMAL HIGH (ref 0.00–0.80)
Neutrophils: 9.03 10*3/uL — ABNORMAL HIGH (ref 1.80–7.00)
Nucleated RBC: 0 10*3/uL
Platelet Count: 1333 10*3/uL — ABNORMAL HIGH (ref 150–400)
RBC: 2.94 mil/uL — ABNORMAL LOW (ref 4.40–5.60)
RDW-CV: 15.1 % — ABNORMAL HIGH (ref 11.6–14.4)
WBC: 14 10*3/uL — ABNORMAL HIGH (ref 4.30–10.00)

## 2011-04-02 LAB — BASIC METABOLIC PANEL
Anion Gap: 8 (ref 3–11)
Calcium: 8.2 mg/dL — ABNORMAL LOW (ref 8.9–10.2)
Carbon Dioxide, Total: 28 mEq/L (ref 22–32)
Chloride: 96 mEq/L — ABNORMAL LOW (ref 98–108)
Creatinine: 0.64 mg/dL (ref 0.51–1.18)
GFR, Calc, African American: >60 mL/min (ref >59)
GFR, Calc, European American: >60 mL/min (ref >59)
Glucose: 112 mg/dL (ref 62–125)
Potassium: 4.4 mEq/L (ref 3.7–5.2)
Potassium: ELEVATED
Sodium: 132 mEq/L — ABNORMAL LOW (ref 136–145)
Urea Nitrogen: 4 mg/dL — ABNORMAL LOW (ref 8–21)

## 2011-04-03 ENCOUNTER — Other Ambulatory Visit (HOSPITAL_COMMUNITY): Payer: Self-pay

## 2011-04-03 ENCOUNTER — Other Ambulatory Visit (HOSPITAL_BASED_OUTPATIENT_CLINIC_OR_DEPARTMENT_OTHER): Payer: Self-pay

## 2011-04-03 LAB — CBC, DIFF
% Basophils: 0 % (ref 0–1)
% Eosinophils: 2 % (ref 0–7)
% Immature Granulocytes: 1 % (ref 0–1)
% Lymphocytes: 16 % — ABNORMAL LOW (ref 19–53)
% Monocytes: 20 % — ABNORMAL HIGH (ref 5–13)
% Neutrophils: 61 % (ref 34–71)
% Nucleated RBC: 0 %
Absolute Eosinophil Count: 0.24 10*3/uL (ref 0.00–0.50)
Absolute Lymphocyte Count: 2.05 10*3/uL (ref 1.00–4.80)
Basophils: 0.04 10*3/uL (ref 0.00–0.20)
Hematocrit: 27 % — ABNORMAL LOW (ref 38–50)
Hemoglobin: 8.5 g/dL — ABNORMAL LOW (ref 13.0–18.0)
Immature Granulocytes: 0.1 10*3/uL — ABNORMAL HIGH (ref 0.00–0.05)
MCH: 29.1 pg (ref 27.3–33.6)
MCHC: 31.5 g/dL — ABNORMAL LOW (ref 32.2–36.5)
MCV: 93 fL (ref 81–98)
Monocytes: 2.6 10*3/uL — ABNORMAL HIGH (ref 0.00–0.80)
Neutrophils: 7.95 10*3/uL — ABNORMAL HIGH (ref 1.80–7.00)
Nucleated RBC: 0 10*3/uL
Platelet Count: 1209 10*3/uL — ABNORMAL HIGH (ref 150–400)
RBC: 2.92 mil/uL — ABNORMAL LOW (ref 4.40–5.60)
RDW-CV: 15.1 % — ABNORMAL HIGH (ref 11.6–14.4)
WBC: 12.88 10*3/uL — ABNORMAL HIGH (ref 4.30–10.00)

## 2011-04-03 LAB — VANCOMYCIN, TROUGH LEVEL: Vancomycin, Trough Level: 13.1 ug/mL (ref 5.0–20.0)

## 2011-04-04 ENCOUNTER — Other Ambulatory Visit (HOSPITAL_COMMUNITY): Payer: Self-pay | Admitting: Orthopaedic Surgery

## 2011-04-04 LAB — CBC, DIFF
% Basophils: 0 % (ref 0–1)
% Eosinophils: 2 % (ref 0–7)
% Immature Granulocytes: 1 % (ref 0–1)
% Lymphocytes: 17 % — ABNORMAL LOW (ref 19–53)
% Monocytes: 22 % — ABNORMAL HIGH (ref 5–13)
% Neutrophils: 58 % (ref 34–71)
% Nucleated RBC: 0 %
Absolute Eosinophil Count: 0.19 10*3/uL (ref 0.00–0.50)
Absolute Lymphocyte Count: 2.06 10*3/uL (ref 1.00–4.80)
Basophils: 0.02 10*3/uL (ref 0.00–0.20)
Hematocrit: 27 % — ABNORMAL LOW (ref 38–50)
Hemoglobin: 8.3 g/dL — ABNORMAL LOW (ref 13.0–18.0)
Immature Granulocytes: 0.06 10*3/uL — ABNORMAL HIGH (ref 0.00–0.05)
MCH: 28.9 pg (ref 27.3–33.6)
MCHC: 31 g/dL — ABNORMAL LOW (ref 32.2–36.5)
MCV: 93 fL (ref 81–98)
Monocytes: 2.58 10*3/uL — ABNORMAL HIGH (ref 0.00–0.80)
Neutrophils: 7.15 10*3/uL — ABNORMAL HIGH (ref 1.80–7.00)
Nucleated RBC: 0.05 10*3/uL — ABNORMAL HIGH
Platelet Count: 1091 10*3/uL — ABNORMAL HIGH (ref 150–400)
RBC: 2.87 mil/uL — ABNORMAL LOW (ref 4.40–5.60)
RDW-CV: 15.3 % — ABNORMAL HIGH (ref 11.6–14.4)
WBC: 12 10*3/uL — ABNORMAL HIGH (ref 4.30–10.00)

## 2011-04-05 ENCOUNTER — Other Ambulatory Visit (HOSPITAL_COMMUNITY): Payer: Self-pay | Admitting: Orthopaedic Surgery

## 2011-04-05 ENCOUNTER — Other Ambulatory Visit (HOSPITAL_COMMUNITY): Payer: Self-pay

## 2011-04-05 LAB — VANCOMYCIN, TROUGH LEVEL: Vancomycin, Trough Level: 19.3 ug/mL (ref 5.0–20.0)

## 2011-04-06 ENCOUNTER — Other Ambulatory Visit (HOSPITAL_COMMUNITY): Payer: Self-pay

## 2011-04-06 LAB — CBC (HEMOGRAM)
Hematocrit: 24 % — ABNORMAL LOW (ref 38–50)
Hemoglobin: 7.4 g/dL — ABNORMAL LOW (ref 13.0–18.0)
MCH: 28.4 pg (ref 27.3–33.6)
MCHC: 31.2 g/dL — ABNORMAL LOW (ref 32.2–36.5)
MCV: 91 fL (ref 81–98)
Platelet Count: 822 10*3/uL — ABNORMAL HIGH (ref 150–400)
RBC: 2.61 mil/uL — ABNORMAL LOW (ref 4.40–5.60)
RDW-CV: 15 % — ABNORMAL HIGH (ref 11.6–14.4)
WBC: 12.79 10*3/uL — ABNORMAL HIGH (ref 4.30–10.00)

## 2011-04-06 LAB — BASIC METABOLIC PANEL
Anion Gap: 6 (ref 3–11)
Calcium: 8 mg/dL — ABNORMAL LOW (ref 8.9–10.2)
Carbon Dioxide, Total: 29 mEq/L (ref 22–32)
Chloride: 95 mEq/L — ABNORMAL LOW (ref 98–108)
Creatinine: 0.58 mg/dL (ref 0.51–1.18)
GFR, Calc, African American: >60 mL/min (ref >59)
GFR, Calc, European American: >60 mL/min (ref >59)
Glucose: 105 mg/dL (ref 62–125)
Potassium: 4.1 mEq/L (ref 3.7–5.2)
Potassium: ELEVATED
Sodium: 130 mEq/L — ABNORMAL LOW (ref 136–145)
Urea Nitrogen: 6 mg/dL — ABNORMAL LOW (ref 8–21)

## 2011-04-06 LAB — VANCOMYCIN, TROUGH LEVEL: Vancomycin, Trough Level: 24.7 ug/mL — ABNORMAL HIGH (ref 5.0–20.0)

## 2011-04-07 ENCOUNTER — Other Ambulatory Visit (HOSPITAL_COMMUNITY): Payer: Self-pay | Admitting: Orthopaedic Surgery

## 2011-04-07 ENCOUNTER — Other Ambulatory Visit (HOSPITAL_COMMUNITY): Payer: Self-pay

## 2011-04-07 LAB — TYPE AND SCREEN
ABO/Rh: O POS
Antibody Screen: NEGATIVE
Units Ordered: 2

## 2011-04-07 LAB — VANCOMYCIN, TROUGH LEVEL: Vancomycin, Trough Level: 9.4 ug/mL (ref 5.0–20.0)

## 2011-04-07 LAB — HEMATOCRIT: Hematocrit: 29 % — ABNORMAL LOW (ref 38–50)

## 2011-04-08 ENCOUNTER — Other Ambulatory Visit (HOSPITAL_COMMUNITY): Payer: Self-pay

## 2011-04-08 ENCOUNTER — Other Ambulatory Visit (HOSPITAL_COMMUNITY): Payer: Self-pay | Admitting: Orthopaedic Surgery

## 2011-04-08 LAB — BASIC METABOLIC PANEL
Anion Gap: 9 (ref 3–11)
Calcium: 8.4 mg/dL — ABNORMAL LOW (ref 8.9–10.2)
Carbon Dioxide, Total: 26 mEq/L (ref 22–32)
Chloride: 99 mEq/L (ref 98–108)
Creatinine: 0.55 mg/dL (ref 0.51–1.18)
GFR, Calc, African American: >60 mL/min (ref >59)
GFR, Calc, European American: >60 mL/min (ref >59)
Glucose: 101 mg/dL (ref 62–125)
Potassium: 4.3 mEq/L (ref 3.7–5.2)
Sodium: 134 mEq/L — ABNORMAL LOW (ref 136–145)
Urea Nitrogen: 4 mg/dL — ABNORMAL LOW (ref 8–21)

## 2011-04-08 LAB — HEMATOCRIT: Hematocrit: 31 % — ABNORMAL LOW (ref 38–50)

## 2011-04-08 LAB — VANCOMYCIN, TROUGH LEVEL: Vancomycin, Trough Level: 18.9 ug/mL (ref 5.0–20.0)

## 2011-04-09 ENCOUNTER — Other Ambulatory Visit (HOSPITAL_COMMUNITY): Payer: Self-pay | Admitting: Orthopaedic Surgery

## 2011-04-09 LAB — BASIC METABOLIC PANEL
Anion Gap: 6 (ref 3–11)
Calcium: 8.4 mg/dL — ABNORMAL LOW (ref 8.9–10.2)
Carbon Dioxide, Total: 31 mEq/L (ref 22–32)
Chloride: 100 mEq/L (ref 98–108)
Creatinine: 0.55 mg/dL (ref 0.51–1.18)
GFR, Calc, African American: >60 mL/min (ref >59)
GFR, Calc, European American: >60 mL/min (ref >59)
Glucose: 107 mg/dL (ref 62–125)
Potassium: 4.5 mEq/L (ref 3.7–5.2)
Sodium: 137 mEq/L (ref 136–145)
Urea Nitrogen: 4 mg/dL — ABNORMAL LOW (ref 8–21)

## 2011-04-09 LAB — CBC, DIFF
% Basophils: 0 % (ref 0–1)
% Eosinophils: 8 % — ABNORMAL HIGH (ref 0–7)
% Immature Granulocytes: 1 % (ref 0–1)
% Lymphocytes: 28 % (ref 19–53)
% Monocytes: 28 % — ABNORMAL HIGH (ref 5–13)
% Neutrophils: 35 % (ref 34–71)
% Nucleated RBC: 0 %
Absolute Eosinophil Count: 0.71 10*3/uL — ABNORMAL HIGH (ref 0.00–0.50)
Absolute Lymphocyte Count: 2.37 10*3/uL (ref 1.00–4.80)
Basophils: 0.02 10*3/uL (ref 0.00–0.20)
Hematocrit: 32 % — ABNORMAL LOW (ref 38–50)
Hemoglobin: 9.9 g/dL — ABNORMAL LOW (ref 13.0–18.0)
Immature Granulocytes: 0.05 10*3/uL (ref 0.00–0.05)
MCH: 27.6 pg (ref 27.3–33.6)
MCHC: 31.3 g/dL — ABNORMAL LOW (ref 32.2–36.5)
MCV: 88 fL (ref 81–98)
Monocytes: 2.36 10*3/uL — ABNORMAL HIGH (ref 0.00–0.80)
Neutrophils: 2.93 10*3/uL (ref 1.80–7.00)
Nucleated RBC: 0 10*3/uL
Platelet Count: 703 10*3/uL — ABNORMAL HIGH (ref 150–400)
RBC: 3.59 mil/uL — ABNORMAL LOW (ref 4.40–5.60)
RDW-CV: 18 % — ABNORMAL HIGH (ref 11.6–14.4)
WBC: 8.44 10*3/uL (ref 4.30–10.00)

## 2011-04-12 ENCOUNTER — Other Ambulatory Visit (HOSPITAL_COMMUNITY): Payer: Self-pay

## 2011-04-12 LAB — BASIC METABOLIC PANEL
Anion Gap: 7 (ref 3–11)
Calcium: 8.8 mg/dL — ABNORMAL LOW (ref 8.9–10.2)
Carbon Dioxide, Total: 30 mEq/L (ref 22–32)
Chloride: 99 mEq/L (ref 98–108)
Creatinine: 0.72 mg/dL (ref 0.51–1.18)
GFR, Calc, African American: >60 mL/min (ref >59)
GFR, Calc, European American: >60 mL/min (ref >59)
Glucose: 97 mg/dL (ref 62–125)
Potassium: 4.1 mEq/L (ref 3.7–5.2)
Potassium: ELEVATED
Sodium: 136 mEq/L (ref 136–145)
Urea Nitrogen: 5 mg/dL — ABNORMAL LOW (ref 8–21)

## 2011-04-12 LAB — CBC (HEMOGRAM)
Hematocrit: 31 % — ABNORMAL LOW (ref 38–50)
Hemoglobin: 9.6 g/dL — ABNORMAL LOW (ref 13.0–18.0)
MCH: 26.7 pg — ABNORMAL LOW (ref 27.3–33.6)
MCHC: 31.3 g/dL — ABNORMAL LOW (ref 32.2–36.5)
MCV: 86 fL (ref 81–98)
Platelet Count: 574 10*3/uL — ABNORMAL HIGH (ref 150–400)
RBC: 3.59 mil/uL — ABNORMAL LOW (ref 4.40–5.60)
RDW-CV: 17.6 % — ABNORMAL HIGH (ref 11.6–14.4)
WBC: 8.07 10*3/uL (ref 4.30–10.00)

## 2011-04-14 ENCOUNTER — Other Ambulatory Visit (HOSPITAL_COMMUNITY): Payer: Self-pay | Admitting: Orthopaedic Surgery

## 2011-04-14 LAB — VANCOMYCIN, TROUGH LEVEL: Vancomycin, Trough Level: 25.3 ug/mL — ABNORMAL HIGH (ref 5.0–20.0)

## 2011-04-15 ENCOUNTER — Other Ambulatory Visit (HOSPITAL_COMMUNITY): Payer: Self-pay

## 2011-04-15 LAB — TISSUE C&S W/GRAM, ORTHO: Gram Smear: NONE SEEN

## 2011-04-15 LAB — VANCOMYCIN, TROUGH LEVEL: Vancomycin, Trough Level: 25.3 ug/mL — ABNORMAL HIGH (ref 5.0–20.0)

## 2011-04-16 ENCOUNTER — Other Ambulatory Visit (HOSPITAL_COMMUNITY): Payer: Self-pay

## 2011-04-16 LAB — BASIC METABOLIC PANEL
Anion Gap: 7 (ref 3–11)
Calcium: 8.8 mg/dL — ABNORMAL LOW (ref 8.9–10.2)
Carbon Dioxide, Total: 28 mEq/L (ref 22–32)
Chloride: 102 mEq/L (ref 98–108)
Creatinine: 0.64 mg/dL (ref 0.51–1.18)
GFR, Calc, African American: >60 mL/min (ref >59)
GFR, Calc, European American: >60 mL/min (ref >59)
Glucose: 95 mg/dL (ref 62–125)
Potassium: 4.2 mEq/L (ref 3.7–5.2)
Potassium: ELEVATED
Sodium: 137 mEq/L (ref 136–145)
Urea Nitrogen: 5 mg/dL — ABNORMAL LOW (ref 8–21)

## 2011-04-16 LAB — CBC, DIFF
% Basophils: 0 % (ref 0–1)
% Eosinophils: 6 % (ref 0–7)
% Immature Granulocytes: 0 % (ref 0–1)
% Lymphocytes: 38 % (ref 19–53)
% Monocytes: 26 % — ABNORMAL HIGH (ref 5–13)
% Neutrophils: 30 % — ABNORMAL LOW (ref 34–71)
Absolute Eosinophil Count: 0.36 10*3/uL (ref 0.00–0.50)
Absolute Lymphocyte Count: 2.44 10*3/uL (ref 1.00–4.80)
Basophils: 0.02 10*3/uL (ref 0.00–0.20)
Hematocrit: 32 % — ABNORMAL LOW (ref 38–50)
Hemoglobin: 10 g/dL — ABNORMAL LOW (ref 13.0–18.0)
Immature Granulocytes: 0.02 10*3/uL (ref 0.00–0.05)
MCH: 26.5 pg — ABNORMAL LOW (ref 27.3–33.6)
MCHC: 31.1 g/dL — ABNORMAL LOW (ref 32.2–36.5)
MCV: 85 fL (ref 81–98)
Monocytes: 1.69 10*3/uL — ABNORMAL HIGH (ref 0.00–0.80)
Neutrophils: 1.89 10*3/uL (ref 1.80–7.00)
Platelet Count: 656 10*3/uL — ABNORMAL HIGH (ref 150–400)
RBC: 3.77 mil/uL — ABNORMAL LOW (ref 4.40–5.60)
RDW-CV: 17.4 % — ABNORMAL HIGH (ref 11.6–14.4)
WBC: 6.42 10*3/uL (ref 4.30–10.00)

## 2011-04-16 LAB — VANCOMYCIN, TROUGH LEVEL: Vancomycin, Trough Level: 16.3 ug/mL (ref 5.0–20.0)

## 2011-04-17 ENCOUNTER — Other Ambulatory Visit (HOSPITAL_COMMUNITY): Payer: Self-pay

## 2011-04-17 LAB — CBC (HEMOGRAM)
Hematocrit: 30 % — ABNORMAL LOW (ref 38–50)
Hemoglobin: 9.3 g/dL — ABNORMAL LOW (ref 13.0–18.0)
MCH: 26.6 pg — ABNORMAL LOW (ref 27.3–33.6)
MCHC: 31.1 g/dL — ABNORMAL LOW (ref 32.2–36.5)
MCV: 85 fL (ref 81–98)
Platelet Count: 723 10*3/uL — ABNORMAL HIGH (ref 150–400)
RBC: 3.5 mil/uL — ABNORMAL LOW (ref 4.40–5.60)
RDW-CV: 17.4 % — ABNORMAL HIGH (ref 11.6–14.4)
WBC: 8.44 10*3/uL (ref 4.30–10.00)

## 2011-04-17 LAB — BASIC METABOLIC PANEL
Anion Gap: 7 (ref 3–11)
Calcium: 8.6 mg/dL — ABNORMAL LOW (ref 8.9–10.2)
Carbon Dioxide, Total: 28 mEq/L (ref 22–32)
Chloride: 100 mEq/L (ref 98–108)
Creatinine: 0.67 mg/dL (ref 0.51–1.18)
GFR, Calc, African American: >60 mL/min (ref >59)
GFR, Calc, European American: >60 mL/min (ref >59)
Glucose: 114 mg/dL (ref 62–125)
Potassium: 3.6 mEq/L — ABNORMAL LOW (ref 3.7–5.2)
Potassium: ELEVATED
Sodium: 135 mEq/L — ABNORMAL LOW (ref 136–145)
Urea Nitrogen: 5 mg/dL — ABNORMAL LOW (ref 8–21)

## 2011-04-17 LAB — TRANSTHYRETIN (PRE-ALBUMIN): Transthyretin (Pre_Albumin): 20.7 mg/dL (ref 19.0–45.0)

## 2011-04-17 LAB — ALBUMIN, SERUM: Albumin: 2.4 g/dL — ABNORMAL LOW (ref 3.5–5.2)

## 2011-04-17 LAB — FOLATE: Folate, SRM: >22 ng/mL (ref >5.8)

## 2011-04-17 LAB — CALCIUM, (REFLEXIVE IONIZED)

## 2011-04-17 LAB — ZINC: Zinc: 92 ug/dL (ref 60–120)

## 2011-04-17 LAB — MAGNESIUM: Magnesium: 1.8 mg/dL (ref 1.8–2.4)

## 2011-04-18 ENCOUNTER — Other Ambulatory Visit (HOSPITAL_COMMUNITY): Payer: Self-pay

## 2011-04-18 LAB — VANCOMYCIN, TROUGH LEVEL: Vancomycin, Trough Level: 15.1 ug/mL (ref 5.0–20.0)

## 2011-04-19 ENCOUNTER — Other Ambulatory Visit (HOSPITAL_COMMUNITY): Payer: Self-pay

## 2011-04-19 ENCOUNTER — Other Ambulatory Visit (HOSPITAL_COMMUNITY): Payer: Self-pay | Admitting: Orthopaedic Surgery

## 2011-04-19 LAB — BASIC METABOLIC PANEL
Anion Gap: 6 (ref 3–11)
Calcium: 8.4 mg/dL — ABNORMAL LOW (ref 8.9–10.2)
Carbon Dioxide, Total: 28 mEq/L (ref 22–32)
Chloride: 102 mEq/L (ref 98–108)
Creatinine: 0.66 mg/dL (ref 0.51–1.18)
GFR, Calc, African American: >60 mL/min (ref >59)
GFR, Calc, European American: >60 mL/min (ref >59)
Glucose: 179 mg/dL — ABNORMAL HIGH (ref 62–125)
Potassium: 4.3 mEq/L (ref 3.7–5.2)
Potassium: ELEVATED
Sodium: 136 mEq/L (ref 136–145)
Urea Nitrogen: 7 mg/dL — ABNORMAL LOW (ref 8–21)

## 2011-04-19 LAB — TYPE AND CROSSMATCH
ABO/Rh: O POS
Antibody Screen: NEGATIVE
Units Ordered: 2

## 2011-04-19 LAB — CBC (HEMOGRAM)
Hematocrit: 30 % — ABNORMAL LOW (ref 38–50)
Hemoglobin: 9.2 g/dL — ABNORMAL LOW (ref 13.0–18.0)
MCH: 26.7 pg — ABNORMAL LOW (ref 27.3–33.6)
MCHC: 31.2 g/dL — ABNORMAL LOW (ref 32.2–36.5)
MCV: 86 fL (ref 81–98)
Platelet Count: 624 10*3/uL — ABNORMAL HIGH (ref 150–400)
RBC: 3.45 mil/uL — ABNORMAL LOW (ref 4.40–5.60)
RDW-CV: 17.2 % — ABNORMAL HIGH (ref 11.6–14.4)
WBC: 6.85 10*3/uL (ref 4.30–10.00)

## 2011-04-19 LAB — WOUND C&S W/GRAM, ORTHO
Culture: NO GROWTH
Gram Smear: NONE SEEN

## 2011-04-19 LAB — CK, CREATINE KINASE, TOTAL ACTIVITY: Creatinine Kinase Total Activity: 40 U/L (ref 30–285)

## 2011-04-22 LAB — WOUND C&S W/GRAM, ORTHO
Culture: NO GROWTH
Gram Smear: NONE SEEN

## 2011-04-24 ENCOUNTER — Other Ambulatory Visit (HOSPITAL_COMMUNITY): Payer: Self-pay

## 2011-04-24 LAB — BASIC METABOLIC PANEL
Anion Gap: 8 (ref 3–11)
Calcium: 8.9 mg/dL (ref 8.9–10.2)
Carbon Dioxide, Total: 27 mEq/L (ref 22–32)
Chloride: 101 mEq/L (ref 98–108)
Creatinine: 0.66 mg/dL (ref 0.51–1.18)
GFR, Calc, African American: >60 mL/min (ref >59)
GFR, Calc, European American: >60 mL/min (ref >59)
Glucose: 106 mg/dL (ref 62–125)
Potassium: 4.1 mEq/L (ref 3.7–5.2)
Potassium: ELEVATED
Sodium: 136 mEq/L (ref 136–145)
Urea Nitrogen: 6 mg/dL — ABNORMAL LOW (ref 8–21)

## 2011-04-24 LAB — CBC, DIFF
% Basophils: 0 % (ref 0–1)
% Eosinophils: 4 % (ref 0–7)
% Immature Granulocytes: 0 % (ref 0–1)
% Lymphocytes: 39 % (ref 19–53)
% Monocytes: 24 % — ABNORMAL HIGH (ref 5–13)
% Neutrophils: 33 % — ABNORMAL LOW (ref 34–71)
Absolute Eosinophil Count: 0.34 10*3/uL (ref 0.00–0.50)
Absolute Lymphocyte Count: 2.94 10*3/uL (ref 1.00–4.80)
Basophils: 0.03 10*3/uL (ref 0.00–0.20)
Hematocrit: 31 % — ABNORMAL LOW (ref 38–50)
Hemoglobin: 9.8 g/dL — ABNORMAL LOW (ref 13.0–18.0)
Immature Granulocytes: 0.02 10*3/uL (ref 0.00–0.05)
MCH: 26.8 pg — ABNORMAL LOW (ref 27.3–33.6)
MCHC: 31.2 g/dL — ABNORMAL LOW (ref 32.2–36.5)
MCV: 86 fL (ref 81–98)
Monocytes: 1.82 10*3/uL — ABNORMAL HIGH (ref 0.00–0.80)
Neutrophils: 2.51 10*3/uL (ref 1.80–7.00)
Platelet Count: 716 10*3/uL — ABNORMAL HIGH (ref 150–400)
RBC: 3.65 mil/uL — ABNORMAL LOW (ref 4.40–5.60)
RDW-CV: 16.9 % — ABNORMAL HIGH (ref 11.6–14.4)
WBC: 7.66 10*3/uL (ref 4.30–10.00)

## 2011-04-25 ENCOUNTER — Other Ambulatory Visit (HOSPITAL_COMMUNITY): Payer: Self-pay

## 2011-04-25 LAB — BASIC METABOLIC PANEL
Anion Gap: 7 (ref 3–11)
Calcium: 8.6 mg/dL — ABNORMAL LOW (ref 8.9–10.2)
Carbon Dioxide, Total: 26 mEq/L (ref 22–32)
Chloride: 104 mEq/L (ref 98–108)
Creatinine: 0.71 mg/dL (ref 0.51–1.18)
GFR, Calc, African American: >60 mL/min (ref >59)
GFR, Calc, European American: >60 mL/min (ref >59)
Glucose: 104 mg/dL (ref 62–125)
Potassium: 4.3 mEq/L (ref 3.7–5.2)
Potassium: ELEVATED
Sodium: 137 mEq/L (ref 136–145)
Urea Nitrogen: 10 mg/dL (ref 8–21)

## 2011-04-25 LAB — CBC (HEMOGRAM)
Hematocrit: 29 % — ABNORMAL LOW (ref 38–50)
Hemoglobin: 8.9 g/dL — ABNORMAL LOW (ref 13.0–18.0)
MCH: 27.1 pg — ABNORMAL LOW (ref 27.3–33.6)
MCHC: 31 g/dL — ABNORMAL LOW (ref 32.2–36.5)
MCV: 87 fL (ref 81–98)
Platelet Count: 650 10*3/uL — ABNORMAL HIGH (ref 150–400)
RBC: 3.29 mil/uL — ABNORMAL LOW (ref 4.40–5.60)
RDW-CV: 16.7 % — ABNORMAL HIGH (ref 11.6–14.4)
WBC: 8.17 10*3/uL (ref 4.30–10.00)

## 2011-04-29 ENCOUNTER — Other Ambulatory Visit (HOSPITAL_COMMUNITY): Payer: Self-pay | Admitting: Orthopaedic Surgery

## 2011-04-30 ENCOUNTER — Other Ambulatory Visit (HOSPITAL_COMMUNITY): Payer: Self-pay

## 2011-04-30 LAB — CBC, DIFF
% Basophils: 1 % (ref 0–1)
% Eosinophils: 7 % (ref 0–7)
% Immature Granulocytes: 0 % (ref 0–1)
% Lymphocytes: 44 % (ref 19–53)
% Monocytes: 24 % — ABNORMAL HIGH (ref 5–13)
% Neutrophils: 24 % — ABNORMAL LOW (ref 34–71)
Absolute Eosinophil Count: 0.49 10*3/uL (ref 0.00–0.50)
Absolute Lymphocyte Count: 3.36 10*3/uL (ref 1.00–4.80)
Basophils: 0.04 10*3/uL (ref 0.00–0.20)
Hematocrit: 33 % — ABNORMAL LOW (ref 38–50)
Hemoglobin: 10.1 g/dL — ABNORMAL LOW (ref 13.0–18.0)
Immature Granulocytes: 0.01 10*3/uL (ref 0.00–0.05)
MCH: 27 pg — ABNORMAL LOW (ref 27.3–33.6)
MCHC: 31 g/dL — ABNORMAL LOW (ref 32.2–36.5)
MCV: 87 fL (ref 81–98)
Monocytes: 1.77 10*3/uL — ABNORMAL HIGH (ref 0.00–0.80)
Neutrophils: 1.76 10*3/uL — ABNORMAL LOW (ref 1.80–7.00)
Platelet Count: 641 10*3/uL — ABNORMAL HIGH (ref 150–400)
RBC: 3.74 mil/uL — ABNORMAL LOW (ref 4.40–5.60)
RDW-CV: 16.5 % — ABNORMAL HIGH (ref 11.6–14.4)
WBC: 7.43 10*3/uL (ref 4.30–10.00)

## 2011-04-30 LAB — BASIC METABOLIC PANEL
Anion Gap: 8 (ref 3–11)
Calcium: 9.3 mg/dL (ref 8.9–10.2)
Carbon Dioxide, Total: 28 mEq/L (ref 22–32)
Chloride: 100 mEq/L (ref 98–108)
Creatinine: 0.77 mg/dL (ref 0.51–1.18)
GFR, Calc, African American: >60 mL/min (ref >59)
GFR, Calc, European American: >60 mL/min (ref >59)
Glucose: 96 mg/dL (ref 62–125)
Potassium: 4.4 mEq/L (ref 3.7–5.2)
Potassium: ELEVATED
Sodium: 136 mEq/L (ref 136–145)
Urea Nitrogen: 7 mg/dL — ABNORMAL LOW (ref 8–21)

## 2011-04-30 LAB — CK, CREATINE KINASE, TOTAL ACTIVITY: Creatinine Kinase Total Activity: 21 U/L — ABNORMAL LOW (ref 30–285)

## 2011-04-30 LAB — HEPATIC FUNCTION PANEL
ALT (GPT): 16 U/L (ref 10–64)
AST (GOT): 21 U/L (ref 15–40)
Albumin: 3 g/dL — ABNORMAL LOW (ref 3.5–5.2)
Alkaline Phosphatase (Total): 150 U/L — ABNORMAL HIGH (ref 35–109)
Bilirubin (Direct): <0.1 mg/dL (ref 0.0–0.3)
Bilirubin (Total): 0.5 mg/dL (ref 0.2–1.3)
Protein (Total): 5.6 g/dL — ABNORMAL LOW (ref 6.0–8.2)

## 2011-05-03 LAB — WOUND C&S W/GRAM, ORTHO: Culture: NO GROWTH

## 2011-05-03 LAB — R/O STAPH AUREUS C/S

## 2011-05-13 ENCOUNTER — Inpatient Hospital Stay (HOSPITAL_BASED_OUTPATIENT_CLINIC_OR_DEPARTMENT_OTHER): Payer: Self-pay | Admitting: Acute Care

## 2011-05-16 ENCOUNTER — Ambulatory Visit (HOSPITAL_BASED_OUTPATIENT_CLINIC_OR_DEPARTMENT_OTHER): Payer: No Typology Code available for payment source

## 2011-05-16 ENCOUNTER — Ambulatory Visit (HOSPITAL_BASED_OUTPATIENT_CLINIC_OR_DEPARTMENT_OTHER): Payer: No Typology Code available for payment source | Admitting: Acute Care

## 2011-05-16 ENCOUNTER — Other Ambulatory Visit (HOSPITAL_BASED_OUTPATIENT_CLINIC_OR_DEPARTMENT_OTHER): Payer: Self-pay | Admitting: Orthopaedic Surgery

## 2011-05-16 ENCOUNTER — Ambulatory Visit (HOSPITAL_BASED_OUTPATIENT_CLINIC_OR_DEPARTMENT_OTHER): Payer: No Typology Code available for payment source | Attending: Infectious Disease | Admitting: Infectious Disease

## 2011-05-16 ENCOUNTER — Other Ambulatory Visit (HOSPITAL_BASED_OUTPATIENT_CLINIC_OR_DEPARTMENT_OTHER): Payer: Self-pay

## 2011-05-16 ENCOUNTER — Ambulatory Visit (HOSPITAL_BASED_OUTPATIENT_CLINIC_OR_DEPARTMENT_OTHER)
Payer: No Typology Code available for payment source | Attending: Orthopaedic Surgery | Admitting: Case Manager/Care Coordinator

## 2011-05-16 ENCOUNTER — Other Ambulatory Visit (HOSPITAL_COMMUNITY): Payer: Self-pay

## 2011-05-16 DIAGNOSIS — Z4789 Encounter for other orthopedic aftercare: Secondary | ICD-10-CM | POA: Insufficient documentation

## 2011-05-16 DIAGNOSIS — A4901 Methicillin susceptible Staphylococcus aureus infection, unspecified site: Secondary | ICD-10-CM | POA: Insufficient documentation

## 2011-05-16 DIAGNOSIS — S3210XA Unspecified fracture of sacrum, initial encounter for closed fracture: Secondary | ICD-10-CM | POA: Insufficient documentation

## 2011-05-16 DIAGNOSIS — IMO0001 Reserved for inherently not codable concepts without codable children: Secondary | ICD-10-CM | POA: Insufficient documentation

## 2011-05-16 DIAGNOSIS — Z5189 Encounter for other specified aftercare: Secondary | ICD-10-CM | POA: Insufficient documentation

## 2011-05-16 DIAGNOSIS — S7290XD Unspecified fracture of unspecified femur, subsequent encounter for closed fracture with routine healing: Secondary | ICD-10-CM | POA: Insufficient documentation

## 2011-05-16 DIAGNOSIS — T8489XA Other specified complication of internal orthopedic prosthetic devices, implants and grafts, initial encounter: Secondary | ICD-10-CM | POA: Insufficient documentation

## 2011-05-16 DIAGNOSIS — S322XXA Fracture of coccyx, initial encounter for closed fracture: Secondary | ICD-10-CM | POA: Insufficient documentation

## 2011-05-16 DIAGNOSIS — M869 Osteomyelitis, unspecified: Secondary | ICD-10-CM | POA: Insufficient documentation

## 2011-05-16 LAB — BASIC METABOLIC PANEL
Anion Gap: 11 (ref 3–11)
Calcium: 9.4 mg/dL (ref 8.9–10.2)
Carbon Dioxide, Total: 25 mEq/L (ref 22–32)
Chloride: 103 mEq/L (ref 98–108)
Creatinine: 0.72 mg/dL (ref 0.51–1.18)
GFR, Calc, African American: >60 mL/min (ref >59)
GFR, Calc, European American: >60 mL/min (ref >59)
Glucose: 115 mg/dL (ref 62–125)
Potassium: 4 mEq/L (ref 3.7–5.2)
Potassium: ELEVATED
Sodium: 139 mEq/L (ref 136–145)
Urea Nitrogen: 10 mg/dL (ref 8–21)

## 2011-05-16 LAB — HEPATIC FUNCTION PANEL
ALT (GPT): 35 U/L (ref 10–64)
AST (GOT): 24 U/L (ref 15–40)
Albumin: 3.8 g/dL (ref 3.5–5.2)
Alkaline Phosphatase (Total): 154 U/L — ABNORMAL HIGH (ref 35–109)
Bilirubin (Direct): <0.1 mg/dL (ref 0.0–0.3)
Bilirubin (Total): 0.5 mg/dL (ref 0.2–1.3)
Protein (Total): 6.5 g/dL (ref 6.0–8.2)

## 2011-05-16 LAB — CBC, DIFF
% Basophils: 1 % (ref 0–1)
% Eosinophils: 13 % — ABNORMAL HIGH (ref 0–7)
% Immature Granulocytes: 0 % (ref 0–1)
% Lymphocytes: 33 % (ref 19–53)
% Monocytes: 17 % — ABNORMAL HIGH (ref 5–13)
% Neutrophils: 36 % (ref 34–71)
Absolute Eosinophil Count: 1.13 10*3/uL — ABNORMAL HIGH (ref 0.00–0.50)
Absolute Lymphocyte Count: 2.8 10*3/uL (ref 1.00–4.80)
Basophils: 0.06 10*3/uL (ref 0.00–0.20)
Hematocrit: 37 % — ABNORMAL LOW (ref 38–50)
Hemoglobin: 12.2 g/dL — ABNORMAL LOW (ref 13.0–18.0)
Immature Granulocytes: 0.01 10*3/uL (ref 0.00–0.05)
MCH: 28 pg (ref 27.3–33.6)
MCHC: 33 g/dL (ref 32.2–36.5)
MCV: 85 fL (ref 81–98)
Monocytes: 1.44 10*3/uL — ABNORMAL HIGH (ref 0.00–0.80)
Neutrophils: 3.18 10*3/uL (ref 1.80–7.00)
Platelet Count: 580 10*3/uL — ABNORMAL HIGH (ref 150–400)
RBC: 4.35 mil/uL — ABNORMAL LOW (ref 4.40–5.60)
RDW-CV: 15 % — ABNORMAL HIGH (ref 11.6–14.4)
WBC: 8.62 10*3/uL (ref 4.30–10.00)

## 2011-05-16 LAB — X-RAY FEMUR 2 VIEW LEFT

## 2011-05-16 LAB — CK, CREATINE KINASE, TOTAL ACTIVITY: Creatinine Kinase Total Activity: 35 U/L (ref 30–285)

## 2011-05-16 LAB — PR X-RAY PELVIS 3+ VW

## 2011-05-30 ENCOUNTER — Other Ambulatory Visit (HOSPITAL_BASED_OUTPATIENT_CLINIC_OR_DEPARTMENT_OTHER): Payer: Self-pay | Admitting: Infectious Disease

## 2011-05-30 ENCOUNTER — Ambulatory Visit (HOSPITAL_BASED_OUTPATIENT_CLINIC_OR_DEPARTMENT_OTHER): Payer: No Typology Code available for payment source | Attending: Infectious Disease | Admitting: Infectious Disease

## 2011-05-30 ENCOUNTER — Ambulatory Visit (HOSPITAL_BASED_OUTPATIENT_CLINIC_OR_DEPARTMENT_OTHER): Payer: No Typology Code available for payment source

## 2011-05-30 DIAGNOSIS — Z452 Encounter for adjustment and management of vascular access device: Secondary | ICD-10-CM | POA: Insufficient documentation

## 2011-05-30 LAB — ANGIO TUNNELED LINE

## 2011-07-04 ENCOUNTER — Other Ambulatory Visit (HOSPITAL_BASED_OUTPATIENT_CLINIC_OR_DEPARTMENT_OTHER): Payer: Self-pay | Admitting: Orthopaedic Surgery

## 2011-07-04 ENCOUNTER — Ambulatory Visit (HOSPITAL_BASED_OUTPATIENT_CLINIC_OR_DEPARTMENT_OTHER): Payer: No Typology Code available for payment source | Attending: Orthopaedic Surgery

## 2011-07-04 ENCOUNTER — Ambulatory Visit (HOSPITAL_BASED_OUTPATIENT_CLINIC_OR_DEPARTMENT_OTHER): Payer: No Typology Code available for payment source | Attending: Infectious Disease | Admitting: Infectious Disease

## 2011-07-04 DIAGNOSIS — IMO0001 Reserved for inherently not codable concepts without codable children: Secondary | ICD-10-CM | POA: Insufficient documentation

## 2011-07-04 DIAGNOSIS — Z4789 Encounter for other orthopedic aftercare: Secondary | ICD-10-CM | POA: Insufficient documentation

## 2011-07-04 DIAGNOSIS — D721 Eosinophilia, unspecified: Secondary | ICD-10-CM | POA: Insufficient documentation

## 2011-07-04 DIAGNOSIS — T847XXA Infection and inflammatory reaction due to other internal orthopedic prosthetic devices, implants and grafts, initial encounter: Secondary | ICD-10-CM | POA: Insufficient documentation

## 2011-07-04 LAB — PR X-RAY KNEE 1-2 VIEWS LEFT

## 2011-07-04 LAB — X-RAY FEMUR 2 VIEW LEFT

## 2011-09-26 ENCOUNTER — Other Ambulatory Visit (HOSPITAL_BASED_OUTPATIENT_CLINIC_OR_DEPARTMENT_OTHER): Payer: Self-pay | Admitting: Physician Assistant

## 2011-09-26 ENCOUNTER — Ambulatory Visit (HOSPITAL_BASED_OUTPATIENT_CLINIC_OR_DEPARTMENT_OTHER): Payer: No Typology Code available for payment source | Attending: Physician Assistant

## 2011-09-26 DIAGNOSIS — S7290XD Unspecified fracture of unspecified femur, subsequent encounter for closed fracture with routine healing: Secondary | ICD-10-CM | POA: Insufficient documentation

## 2011-09-26 LAB — X-RAY FEMUR 2 VIEW LEFT

## 2011-10-17 ENCOUNTER — Ambulatory Visit: Payer: No Typology Code available for payment source

## 2011-10-17 ENCOUNTER — Ambulatory Visit (HOSPITAL_BASED_OUTPATIENT_CLINIC_OR_DEPARTMENT_OTHER): Payer: No Typology Code available for payment source | Attending: Orthopaedic Surgery

## 2011-10-17 ENCOUNTER — Other Ambulatory Visit (HOSPITAL_BASED_OUTPATIENT_CLINIC_OR_DEPARTMENT_OTHER): Payer: Self-pay | Admitting: Orthopaedic Surgery

## 2011-10-17 DIAGNOSIS — IMO0002 Reserved for concepts with insufficient information to code with codable children: Secondary | ICD-10-CM | POA: Insufficient documentation

## 2011-10-17 DIAGNOSIS — M9689 Other intraoperative and postprocedural complications and disorders of the musculoskeletal system: Secondary | ICD-10-CM | POA: Insufficient documentation

## 2011-10-17 LAB — PR CT FEMUR LEFT WO CONTRAST

## 2011-12-12 ENCOUNTER — Encounter (HOSPITAL_BASED_OUTPATIENT_CLINIC_OR_DEPARTMENT_OTHER): Payer: No Typology Code available for payment source

## 2012-01-16 ENCOUNTER — Other Ambulatory Visit (HOSPITAL_BASED_OUTPATIENT_CLINIC_OR_DEPARTMENT_OTHER): Payer: Self-pay | Admitting: Orthopaedic Surgery

## 2012-01-16 ENCOUNTER — Ambulatory Visit (HOSPITAL_BASED_OUTPATIENT_CLINIC_OR_DEPARTMENT_OTHER): Payer: No Typology Code available for payment source | Attending: Orthopaedic Surgery

## 2012-01-16 ENCOUNTER — Other Ambulatory Visit (HOSPITAL_BASED_OUTPATIENT_CLINIC_OR_DEPARTMENT_OTHER): Payer: Self-pay

## 2012-01-16 DIAGNOSIS — Z01818 Encounter for other preprocedural examination: Secondary | ICD-10-CM | POA: Insufficient documentation

## 2012-01-16 DIAGNOSIS — IMO0002 Reserved for concepts with insufficient information to code with codable children: Secondary | ICD-10-CM | POA: Insufficient documentation

## 2012-01-16 DIAGNOSIS — S7290XD Unspecified fracture of unspecified femur, subsequent encounter for closed fracture with routine healing: Secondary | ICD-10-CM | POA: Insufficient documentation

## 2012-01-16 LAB — C_REACTIVE PROTEIN: C_Reactive Protein: 0.8 mg/L (ref 0.0–10.0)

## 2012-01-16 LAB — COMPREHENSIVE/HEPATIC PANEL
ALT (GPT): 39 U/L (ref 10–64)
AST (GOT): 22 U/L (ref 15–40)
Albumin: 4.4 g/dL (ref 3.5–5.2)
Alkaline Phosphatase (Total): 94 U/L (ref 35–109)
Anion Gap: 10 (ref 3–11)
Bilirubin (Direct): 0.1 mg/dL (ref 0.0–0.3)
Bilirubin (Total): 1.1 mg/dL (ref 0.2–1.3)
Calcium: 9.5 mg/dL (ref 8.9–10.2)
Carbon Dioxide, Total: 25 mEq/L (ref 22–32)
Chloride: 105 mEq/L (ref 98–108)
Creatinine: 0.71 mg/dL (ref 0.51–1.18)
GFR, Calc, African American: >60 mL/min (ref >59)
GFR, Calc, European American: >60 mL/min (ref >59)
Glucose: 108 mg/dL (ref 62–125)
Potassium: 4 mEq/L (ref 3.7–5.2)
Protein (Total): 6.7 g/dL (ref 6.0–8.2)
Sodium: 140 mEq/L (ref 136–145)
Urea Nitrogen: 17 mg/dL (ref 8–21)

## 2012-01-16 LAB — TRANSTHYRETIN (PRE-ALBUMIN): Transthyretin (Pre_Albumin): 36.4 mg/dL (ref 19.0–45.0)

## 2012-01-16 LAB — MAGNESIUM: Magnesium: 2 mg/dL (ref 1.8–2.4)

## 2012-01-16 LAB — X-RAY FEMUR 2 VIEW LEFT

## 2012-01-16 LAB — SED RATE: Erythrocyte Sedimentation Rate: 2 mm/hr (ref 0–15)

## 2012-01-16 LAB — FERRITIN: Ferritin: 45 ng/mL (ref 20–230)

## 2012-01-17 LAB — VITAMIN C: Vitamin C: 1 mg/dL (ref 0.46–1.49)

## 2012-01-17 LAB — ZINC PROTOPORPH./HEME RATIO: Zinc Protoporphyrin/Heme Ratio: 40 umol/mol (ref 30–80)

## 2012-01-17 LAB — ZINC: Zinc: 79 ug/dL (ref 60–120)

## 2012-01-20 LAB — VITAMIN A (RETINOL): Vitamin A (Retinol): 653 mcg/L — ABNORMAL HIGH (ref 300–650)

## 2012-01-21 LAB — CAROTENE: Carotene: 102 ug/dL (ref 90–280)

## 2012-02-12 ENCOUNTER — Other Ambulatory Visit (HOSPITAL_COMMUNITY): Payer: Self-pay | Admitting: Orthopaedic Surgery

## 2012-02-12 ENCOUNTER — Other Ambulatory Visit (HOSPITAL_COMMUNITY): Payer: Self-pay | Admitting: Pediatric Orthopaedic Surgery

## 2012-02-12 ENCOUNTER — Other Ambulatory Visit (HOSPITAL_COMMUNITY): Payer: Self-pay

## 2012-02-12 ENCOUNTER — Inpatient Hospital Stay (HOSPITAL_BASED_OUTPATIENT_CLINIC_OR_DEPARTMENT_OTHER)
Admission: RE | Admit: 2012-02-12 | Discharge: 2012-02-15 | DRG: 211 | Disposition: A | Discharge status: Home / Self Care | Payer: No Typology Code available for payment source | Attending: Orthopaedic Surgery | Admitting: Orthopaedic Surgery

## 2012-02-12 ENCOUNTER — Inpatient Hospital Stay (HOSPITAL_COMMUNITY): Payer: No Typology Code available for payment source | Admitting: Orthopaedic Surgery

## 2012-02-12 DIAGNOSIS — IMO0002 Reserved for concepts with insufficient information to code with codable children: Principal | ICD-10-CM | POA: Diagnosis present

## 2012-02-12 DIAGNOSIS — M9689 Other intraoperative and postprocedural complications and disorders of the musculoskeletal system: Secondary | ICD-10-CM | POA: Diagnosis present

## 2012-02-12 DIAGNOSIS — Z9089 Acquired absence of other organs: Secondary | ICD-10-CM

## 2012-02-12 DIAGNOSIS — S8290XS Unspecified fracture of unspecified lower leg, sequela: Secondary | ICD-10-CM

## 2012-02-12 LAB — BASIC METABOLIC PANEL
Anion Gap: 6 (ref 3–11)
Calcium: 9.2 mg/dL (ref 8.9–10.2)
Carbon Dioxide, Total: 28 mEq/L (ref 22–32)
Chloride: 106 mEq/L (ref 98–108)
Creatinine: 0.68 mg/dL (ref 0.51–1.18)
GFR, Calc, African American: >60 mL/min (ref >59)
GFR, Calc, European American: >60 mL/min (ref >59)
Glucose: 103 mg/dL (ref 62–125)
Potassium: 4.3 mEq/L (ref 3.7–5.2)
Sodium: 140 mEq/L (ref 136–145)
Urea Nitrogen: 10 mg/dL (ref 8–21)

## 2012-02-12 LAB — TYPE AND SCREEN
ABO/Rh: O POS
Antibody Screen: NEGATIVE

## 2012-02-12 LAB — TRANSFUSION SERVICES TESTING

## 2012-02-12 LAB — HEMATOCRIT: Hematocrit: 46 % (ref 38–50)

## 2012-02-13 ENCOUNTER — Other Ambulatory Visit (HOSPITAL_COMMUNITY): Payer: Self-pay | Admitting: Pediatric Orthopaedic Surgery

## 2012-02-13 LAB — BASIC METABOLIC PANEL
Anion Gap: 5 (ref 3–11)
Calcium: 8.2 mg/dL — ABNORMAL LOW (ref 8.9–10.2)
Carbon Dioxide, Total: 29 mEq/L (ref 22–32)
Chloride: 103 mEq/L (ref 98–108)
Creatinine: 0.65 mg/dL (ref 0.51–1.18)
GFR, Calc, African American: >60 mL/min (ref >59)
GFR, Calc, European American: >60 mL/min (ref >59)
Glucose: 114 mg/dL (ref 62–125)
Potassium: 3.8 mEq/L (ref 3.7–5.2)
Sodium: 137 mEq/L (ref 136–145)
Urea Nitrogen: 5 mg/dL — ABNORMAL LOW (ref 8–21)

## 2012-02-13 LAB — CBC (HEMOGRAM)
Hematocrit: 39 % (ref 38–50)
Hemoglobin: 13.2 g/dL (ref 13.0–18.0)
MCH: 31.7 pg (ref 27.3–33.6)
MCHC: 34.3 g/dL (ref 32.2–36.5)
MCV: 93 fL (ref 81–98)
Platelet Count: 292 10*3/uL (ref 150–400)
RBC: 4.16 mil/uL — ABNORMAL LOW (ref 4.40–5.60)
RDW-CV: 13.4 % (ref 11.6–14.4)
WBC: 16.72 10*3/uL — ABNORMAL HIGH (ref 4.30–10.00)

## 2012-02-14 ENCOUNTER — Other Ambulatory Visit (HOSPITAL_COMMUNITY): Payer: Self-pay | Admitting: Pediatric Orthopaedic Surgery

## 2012-02-14 LAB — BASIC METABOLIC PANEL
Anion Gap: 6 (ref 3–11)
Calcium: 8.8 mg/dL — ABNORMAL LOW (ref 8.9–10.2)
Carbon Dioxide, Total: 31 mEq/L (ref 22–32)
Chloride: 101 mEq/L (ref 98–108)
Creatinine: 0.69 mg/dL (ref 0.51–1.18)
GFR, Calc, African American: >60 mL/min (ref >59)
GFR, Calc, European American: >60 mL/min (ref >59)
Glucose: 119 mg/dL (ref 62–125)
Potassium: 4.1 mEq/L (ref 3.7–5.2)
Sodium: 138 mEq/L (ref 136–145)
Urea Nitrogen: 5 mg/dL — ABNORMAL LOW (ref 8–21)

## 2012-02-14 LAB — CBC (HEMOGRAM)
Hematocrit: 40 % (ref 38–50)
Hemoglobin: 13.4 g/dL (ref 13.0–18.0)
MCH: 31.4 pg (ref 27.3–33.6)
MCHC: 33.9 g/dL (ref 32.2–36.5)
MCV: 93 fL (ref 81–98)
Platelet Count: 274 10*3/uL (ref 150–400)
RBC: 4.27 mil/uL — ABNORMAL LOW (ref 4.40–5.60)
RDW-CV: 13.4 % (ref 11.6–14.4)
WBC: 18.29 10*3/uL — ABNORMAL HIGH (ref 4.30–10.00)

## 2012-02-15 ENCOUNTER — Other Ambulatory Visit (HOSPITAL_COMMUNITY): Payer: Self-pay | Admitting: Pediatric Orthopaedic Surgery

## 2012-02-15 LAB — CBC (HEMOGRAM)
Hematocrit: 38 % (ref 38–50)
Hemoglobin: 12.8 g/dL — ABNORMAL LOW (ref 13.0–18.0)
MCH: 31.1 pg (ref 27.3–33.6)
MCHC: 33.8 g/dL (ref 32.2–36.5)
MCV: 92 fL (ref 81–98)
Platelet Count: 289 10*3/uL (ref 150–400)
RBC: 4.11 mil/uL — ABNORMAL LOW (ref 4.40–5.60)
RDW-CV: 13.1 % (ref 11.6–14.4)
WBC: 16.75 10*3/uL — ABNORMAL HIGH (ref 4.30–10.00)

## 2012-02-15 LAB — BASIC METABOLIC PANEL
Anion Gap: 5 (ref 3–11)
Calcium: 8.6 mg/dL — ABNORMAL LOW (ref 8.9–10.2)
Carbon Dioxide, Total: 32 mEq/L (ref 22–32)
Chloride: 99 mEq/L (ref 98–108)
Creatinine: 0.69 mg/dL (ref 0.51–1.18)
GFR, Calc, African American: >60 mL/min (ref >59)
GFR, Calc, European American: >60 mL/min (ref >59)
Glucose: 112 mg/dL (ref 62–125)
Potassium: 4.1 mEq/L (ref 3.7–5.2)
Sodium: 136 mEq/L (ref 136–145)
Urea Nitrogen: 7 mg/dL — ABNORMAL LOW (ref 8–21)

## 2012-02-26 LAB — TISSUE C&S W/GRAM, ORTHO
Culture: NO GROWTH
Culture: NO GROWTH
Gram Smear: NONE SEEN
Gram Smear: NONE SEEN

## 2012-02-27 ENCOUNTER — Ambulatory Visit (HOSPITAL_BASED_OUTPATIENT_CLINIC_OR_DEPARTMENT_OTHER): Payer: No Typology Code available for payment source | Attending: Registered Nurse

## 2012-02-27 DIAGNOSIS — Z4802 Encounter for removal of sutures: Secondary | ICD-10-CM | POA: Insufficient documentation

## 2012-03-26 ENCOUNTER — Other Ambulatory Visit (HOSPITAL_BASED_OUTPATIENT_CLINIC_OR_DEPARTMENT_OTHER): Payer: Self-pay | Admitting: Registered Nurse

## 2012-03-26 ENCOUNTER — Ambulatory Visit (HOSPITAL_BASED_OUTPATIENT_CLINIC_OR_DEPARTMENT_OTHER): Payer: No Typology Code available for payment source | Attending: Registered Nurse

## 2012-03-26 DIAGNOSIS — S7290XD Unspecified fracture of unspecified femur, subsequent encounter for closed fracture with routine healing: Secondary | ICD-10-CM | POA: Insufficient documentation

## 2012-03-26 LAB — X-RAY FEMUR 2 VIEW LEFT

## 2012-05-07 ENCOUNTER — Ambulatory Visit (HOSPITAL_BASED_OUTPATIENT_CLINIC_OR_DEPARTMENT_OTHER): Payer: No Typology Code available for payment source | Attending: Orthopaedic Surgery

## 2012-05-07 ENCOUNTER — Other Ambulatory Visit (HOSPITAL_BASED_OUTPATIENT_CLINIC_OR_DEPARTMENT_OTHER): Payer: Self-pay | Admitting: Orthopaedic Surgery

## 2012-05-07 DIAGNOSIS — IMO0002 Reserved for concepts with insufficient information to code with codable children: Secondary | ICD-10-CM | POA: Insufficient documentation

## 2012-05-07 LAB — X-RAY FEMUR 2 VIEW LEFT

## 2012-06-18 ENCOUNTER — Encounter (HOSPITAL_BASED_OUTPATIENT_CLINIC_OR_DEPARTMENT_OTHER): Payer: No Typology Code available for payment source

## 2012-06-25 ENCOUNTER — Other Ambulatory Visit (HOSPITAL_BASED_OUTPATIENT_CLINIC_OR_DEPARTMENT_OTHER): Payer: Self-pay | Admitting: Orthopaedic Surgery

## 2012-06-25 ENCOUNTER — Ambulatory Visit (HOSPITAL_BASED_OUTPATIENT_CLINIC_OR_DEPARTMENT_OTHER): Payer: No Typology Code available for payment source | Attending: Orthopaedic Surgery

## 2012-06-25 DIAGNOSIS — IMO0002 Reserved for concepts with insufficient information to code with codable children: Secondary | ICD-10-CM | POA: Insufficient documentation

## 2012-06-25 LAB — X-RAY FEMUR 2 VIEW LEFT

## 2013-12-30 ENCOUNTER — Encounter (HOSPITAL_BASED_OUTPATIENT_CLINIC_OR_DEPARTMENT_OTHER): Payer: Self-pay

## 2013-12-30 DIAGNOSIS — S7290XA Unspecified fracture of unspecified femur, initial encounter for closed fracture: Secondary | ICD-10-CM

## 2013-12-30 NOTE — Progress Notes (Signed)
Faxed reviewed and signed JA to:    [employer]  [employer]  Ph: [phone]  Fx: [phone]

## 2014-08-30 ENCOUNTER — Encounter (HOSPITAL_COMMUNITY): Payer: Self-pay | Admitting: Emergency Medicine

## 2014-08-30 DIAGNOSIS — R5383 Other fatigue: Secondary | ICD-10-CM | POA: Diagnosis not present

## 2014-08-30 DIAGNOSIS — J45909 Unspecified asthma, uncomplicated: Secondary | ICD-10-CM | POA: Diagnosis not present

## 2014-08-30 DIAGNOSIS — K529 Noninfective gastroenteritis and colitis, unspecified: Secondary | ICD-10-CM | POA: Diagnosis not present

## 2014-08-30 DIAGNOSIS — Z720 Tobacco use: Secondary | ICD-10-CM | POA: Insufficient documentation

## 2014-08-30 DIAGNOSIS — R000 Tachycardia, unspecified: Secondary | ICD-10-CM | POA: Insufficient documentation

## 2014-08-30 DIAGNOSIS — R112 Nausea with vomiting, unspecified: Secondary | ICD-10-CM | POA: Diagnosis present

## 2014-08-30 NOTE — ED Notes (Signed)
Pt. reports emesis ,diarrhea , chills and low grade fever onset this evening .

## 2014-08-31 ENCOUNTER — Emergency Department (HOSPITAL_COMMUNITY)
Admission: EM | Admit: 2014-08-31 | Discharge: 2014-08-31 | Disposition: A | Discharge status: Home / Self Care | Payer: Managed Care, Other (non HMO) | Attending: Emergency Medicine | Admitting: Emergency Medicine

## 2014-08-31 DIAGNOSIS — K529 Noninfective gastroenteritis and colitis, unspecified: Secondary | ICD-10-CM

## 2014-08-31 HISTORY — DX: Unspecified asthma, uncomplicated: J45.909

## 2014-08-31 LAB — COMPREHENSIVE METABOLIC PANEL
ALBUMIN: 4.4 g/dL (ref 3.5–5.2)
ALT: 16 U/L (ref 0–53)
AST: 23 U/L (ref 0–37)
Alkaline Phosphatase: 65 U/L (ref 39–117)
Anion gap: 10 (ref 5–15)
BUN: 11 mg/dL (ref 6–23)
CALCIUM: 9.6 mg/dL (ref 8.4–10.5)
CO2: 22 mmol/L (ref 19–32)
Chloride: 106 mEq/L (ref 96–112)
Creatinine, Ser: 0.96 mg/dL (ref 0.50–1.35)
GFR calc non Af Amer: >90 mL/min (ref >90)
Glucose, Bld: 112 mg/dL — ABNORMAL HIGH (ref 70–99)
Potassium: 4.3 mmol/L (ref 3.5–5.1)
Sodium: 138 mmol/L (ref 135–145)
TOTAL PROTEIN: 7.9 g/dL (ref 6.0–8.3)
Total Bilirubin: 0.7 mg/dL (ref 0.3–1.2)

## 2014-08-31 LAB — CBC WITH DIFFERENTIAL/PLATELET
BASOS ABS: 0 10*3/uL (ref 0.0–0.1)
Basophils Relative: 0 % (ref 0–1)
EOS ABS: 0 10*3/uL (ref 0.0–0.7)
Eosinophils Relative: 0 % (ref 0–5)
HEMATOCRIT: 49.7 % (ref 39.0–52.0)
Hemoglobin: 17.5 g/dL — ABNORMAL HIGH (ref 13.0–17.0)
Lymphocytes Relative: 5 % — ABNORMAL LOW (ref 12–46)
Lymphs Abs: 0.6 10*3/uL — ABNORMAL LOW (ref 0.7–4.0)
MCH: 30.3 pg (ref 26.0–34.0)
MCHC: 35.2 g/dL (ref 30.0–36.0)
MCV: 86 fL (ref 78.0–100.0)
MONO ABS: 0.6 10*3/uL (ref 0.1–1.0)
Monocytes Relative: 5 % (ref 3–12)
Neutro Abs: 11.4 10*3/uL — ABNORMAL HIGH (ref 1.7–7.7)
Neutrophils Relative %: 90 % — ABNORMAL HIGH (ref 43–77)
Platelets: 266 10*3/uL (ref 150–400)
RBC: 5.78 MIL/uL (ref 4.22–5.81)
RDW: 13.8 % (ref 11.5–15.5)
WBC: 12.6 10*3/uL — ABNORMAL HIGH (ref 4.0–10.5)

## 2014-08-31 MED ORDER — ONDANSETRON HCL 4 MG/2ML IJ SOLN
INTRAMUSCULAR | Status: AC
  Filled 2014-08-31: qty 2

## 2014-08-31 MED ORDER — DICYCLOMINE HCL 20 MG PO TABS: 20.0000 mg | ORAL_TABLET | Freq: Two times a day (BID) | ORAL | Status: DC | PRN

## 2014-08-31 MED ORDER — ONDANSETRON 4 MG PO TBDP: ORAL_TABLET | ORAL | Status: DC

## 2014-08-31 NOTE — Discharge Instructions (Signed)

## 2014-09-01 NOTE — ED Provider Notes (Signed)
CSN: [id number]     Arrival date & time 08/30/14  2310 History   First MD Initiated Contact with Patient 08/31/14 0524     Chief Complaint  Patient presents with  . Emesis  . Diarrhea     (Consider location/radiation/quality/duration/timing/severity/associated sxs/prior Treatment) HPI With a PMhx of asthma without complications presents to the ED complaining of sudden onset intermittent episodes of emesis that started approximately at [facility]. Pt is also complaining of associated nausea, chills, fatigue, and diarrhea. He endorses about 10 episodes of emesis and 3 episodes of diarrhea. Reports eating chicken noodle soup before his symptoms started. Pt has not been able to keep fluids or solid foods down and he admits to feeling a bit dehydrated. Pt denies any medicinal allergies, recent travels, sick contacts, abdominal pain, or SOB. No blood in vomit or stool.  Past Medical History  Diagnosis Date  . Asthma    History reviewed. No pertinent past surgical history. No family history on file. History  Substance Use Topics  . Smoking status: Current Every Day Smoker  . Smokeless tobacco: Not on file  . Alcohol Use: Yes    Review of Systems  Constitutional: Positive for fever, chills and fatigue.  Respiratory: Negative for cough and shortness of breath.   Cardiovascular: Negative for chest pain.  Gastrointestinal: Positive for nausea, vomiting and diarrhea. Negative for abdominal pain, constipation and blood in stool.  Genitourinary: Negative for dysuria and flank pain.  Musculoskeletal: Negative for back pain, neck pain and neck stiffness.  Skin: Negative for rash and wound.  Neurological: Negative for dizziness, weakness, light-headedness, numbness and headaches.  All other systems reviewed and are negative.     Allergies  Review of patient's allergies indicates no known allergies.  Home Medications   Prior to Admission medications   Medication Sig Start Date End Date Taking?  Authorizing Provider  dicyclomine (BENTYL) 20 MG tablet Take 1 tablet (20 mg total) by mouth 2 (two) times daily as needed for spasms. 08/31/14   Loren Racer, MD  ondansetron (ZOFRAN ODT) 4 MG disintegrating tablet  ODT q4 hours prn nausea/vomit 08/31/14   Loren Racer, MD   BP 113/76 mmHg  Pulse 122  Temp(Src) 100.9 F (38.3 C) (Oral)  Resp 16  Ht  (1.676 m)  Wt 178 lb (80.74 kg)  BMI 28.74 kg/m2  SpO2 97% Physical Exam  Constitutional: He is oriented to person, place, and time. He appears well-developed and well-nourished. No distress.  HENT:  Head: Normocephalic and atraumatic.  Mouth/Throat: Oropharynx is clear and moist.  Eyes: EOM are normal. Pupils are equal, round, and reactive to light.  Neck: Normal range of motion. Neck supple.  Cardiovascular: Regular rhythm.   Mild tachycardia  Pulmonary/Chest: Effort normal and breath sounds normal. No respiratory distress. He has no wheezes. He has no rales.  Abdominal: Soft. He exhibits no distension and no mass. There is no tenderness. There is no rebound and no guarding.  Increased bowel sounds  Musculoskeletal: Normal range of motion. He exhibits no edema or tenderness.  Neurological: He is alert and oriented to person, place, and time.  Moves all extremities without deficit. Sensation is grossly intact  Skin: Skin is warm and dry. No rash noted. No erythema.  Psychiatric: He has a normal mood and affect. His behavior is normal.  Nursing note and vitals reviewed.   ED Course  Procedures (including critical care time) Labs Review Labs Reviewed  CBC WITH DIFFERENTIAL - Abnormal; Notable for the following:  WBC 12.6 (*)    Hemoglobin 17.5 (*)    Neutrophils Relative % 90 (*)    Neutro Abs 11.4 (*)    Lymphocytes Relative 5 (*)    Lymphs Abs 0.6 (*)    All other components within normal limits  COMPREHENSIVE METABOLIC PANEL - Abnormal; Notable for the following:    Glucose, Bld 112 (*)    All other  components within normal limits    Imaging Review No results found.   EKG Interpretation None      MDM   Final diagnoses:  Gastroenteritis    Patient states he is feeling much better after IV fluids and nausea medication. He's had no further vomiting in the emergency department. He is tolerating fluids by mouth. His abdomen remains soft and benign. I do not believe imaging is necessary at this point. He's been given return precautions and voiced understanding.    Loren Raceravid Shahad Mazurek, MD 09/01/14 1043

## 2016-07-12 ENCOUNTER — Encounter (HOSPITAL_COMMUNITY): Payer: Self-pay | Admitting: Nurse Practitioner

## 2016-07-12 ENCOUNTER — Emergency Department (HOSPITAL_COMMUNITY)
Admission: EM | Admit: 2016-07-12 | Discharge: 2016-07-12 | Disposition: A | Discharge status: Home / Self Care | Payer: Managed Care, Other (non HMO) | Attending: Emergency Medicine | Admitting: Emergency Medicine

## 2016-07-12 DIAGNOSIS — S50861A Insect bite (nonvenomous) of right forearm, initial encounter: Secondary | ICD-10-CM | POA: Insufficient documentation

## 2016-07-12 DIAGNOSIS — Y929 Unspecified place or not applicable: Secondary | ICD-10-CM | POA: Insufficient documentation

## 2016-07-12 DIAGNOSIS — F17200 Nicotine dependence, unspecified, uncomplicated: Secondary | ICD-10-CM | POA: Insufficient documentation

## 2016-07-12 DIAGNOSIS — W57XXXA Bitten or stung by nonvenomous insect and other nonvenomous arthropods, initial encounter: Secondary | ICD-10-CM | POA: Insufficient documentation

## 2016-07-12 DIAGNOSIS — Y999 Unspecified external cause status: Secondary | ICD-10-CM | POA: Insufficient documentation

## 2016-07-12 DIAGNOSIS — Y939 Activity, unspecified: Secondary | ICD-10-CM | POA: Insufficient documentation

## 2016-07-12 DIAGNOSIS — J45909 Unspecified asthma, uncomplicated: Secondary | ICD-10-CM | POA: Insufficient documentation

## 2016-07-12 MED ORDER — IBUPROFEN 400 MG PO TABS
600.0000 mg | ORAL_TABLET | Freq: Once | ORAL | Status: AC
  Administered 2016-07-12: 600 mg via ORAL
  Filled 2016-07-12: qty 1

## 2016-07-12 MED ORDER — IBUPROFEN 600 MG PO TABS: 600.0000 mg | ORAL_TABLET | Freq: Four times a day (QID) | ORAL | 0 refills | Status: DC | PRN

## 2016-07-12 MED ORDER — DOXYCYCLINE HYCLATE 100 MG PO TABS
100.0000 mg | ORAL_TABLET | Freq: Once | ORAL | Status: AC
  Administered 2016-07-12: 100 mg via ORAL
  Filled 2016-07-12: qty 1

## 2016-07-12 MED ORDER — ONDANSETRON 4 MG PO TBDP
4.0000 mg | ORAL_TABLET | Freq: Once | ORAL | Status: AC
  Administered 2016-07-12: 4 mg via ORAL
  Filled 2016-07-12: qty 1

## 2016-07-12 MED ORDER — DOXYCYCLINE HYCLATE 100 MG PO CAPS: 100.0000 mg | ORAL_CAPSULE | Freq: Two times a day (BID) | ORAL | 0 refills | Status: DC

## 2016-07-12 MED ORDER — DIPHENHYDRAMINE HCL 25 MG PO TABS: 25.0000 mg | ORAL_TABLET | Freq: Four times a day (QID) | ORAL | 0 refills | Status: DC

## 2016-07-12 NOTE — ED Provider Notes (Signed)
[facility]-EMERGENCY DEPT Provider Note   CSN: [id number] Arrival date & time: 07/12/16  1527  By signing my name below, I, Teofilo PodMatthew P. Jamison, attest that this documentation has been prepared under the direction and in the presence of Kerrie BuffaloHope Michaeljoseph Revolorio, NP. Electronically Signed: Teofilo PodMatthew P. Jamison, ED Scribe. 07/12/2016. 4:25 PM.    History   Chief Complaint Chief Complaint  Patient presents with  . Skin Problem    The history is provided by the patient. No language interpreter was used.  Rash   This is a new problem. The current episode started 6 to 12 hours ago. The problem has been gradually worsening. The problem is associated with an insect bite/sting. The rash is present on the left arm. The pain has been worsening since onset. Associated symptoms include blisters and itching. He has tried nothing for the symptoms.   HPI Comments:  Rodney White is a 30 y.o. male who presents to the Emergency Department complaining of a possible insect bite that he first noticed today. Pt reports that he is unsure of when the bite occurred, but he first noticed the bite on his left forearm today at work. Pt states that his symptoms have been worsening. Pt states that the area is itchy and burning. Pt complains of associated nausea. No alleviating factors noted. Pt denies other associated symptoms.   Past Medical History:  Diagnosis Date  . Asthma     There are no active problems to display for this patient.   History reviewed. No pertinent surgical history.     Home Medications    Prior to Admission medications   Medication Sig Start Date End Date Taking? Authorizing Provider  dicyclomine (BENTYL) 20 MG tablet Take 1 tablet (20 mg total) by mouth 2 (two) times daily as needed for spasms. 08/31/14   Loren Raceravid Yelverton, MD  diphenhydrAMINE (BENADRYL) 25 MG tablet Take 1 tablet (25 mg total) by mouth every 6 (six) hours. 07/12/16   Brain Honeycutt Orlene OchM Shemaiah Round, NP  doxycycline (VIBRAMYCIN) 100 MG capsule Take 1  capsule (100 mg total) by mouth 2 (two) times daily. 07/12/16   Kavari Parrillo Orlene OchM Dason Mosley, NP  ibuprofen (ADVIL,MOTRIN) 600 MG tablet Take 1 tablet (600 mg total) by mouth every 6 (six) hours as needed. 07/12/16   Murice Barbar Orlene OchM Briele Lagasse, NP  ondansetron (ZOFRAN ODT) 4 MG disintegrating tablet 4mg  ODT q4 hours prn nausea/vomit 08/31/14   Loren Raceravid Yelverton, MD    Family History History reviewed. No pertinent family history.  Social History Social History  Substance Use Topics  . Smoking status: Current Every Day Smoker  . Smokeless tobacco: Never Used  . Alcohol use Yes     Allergies   Patient has no known allergies.   Review of Systems Review of Systems  Gastrointestinal: Positive for nausea.  Skin: Positive for itching and rash.  All other systems reviewed and are negative.    Physical Exam Updated Vital Signs BP 125/74 (BP Location: Right Arm)   Pulse 92   Temp 98.1 F (36.7 C) (Oral)   Resp 17   SpO2 95%   Physical Exam  Constitutional: He appears well-developed and well-nourished. No distress.  HENT:  Head: Normocephalic and atraumatic.  Uvula midline, no edema or erythema.   Eyes: Conjunctivae are normal.  Cardiovascular: Normal rate, regular rhythm and normal heart sounds.   Radial pulses 2+  Pulmonary/Chest: Effort normal and breath sounds normal.  Abdominal: He exhibits no distension.  Neurological: He is alert.  Skin: Skin is warm and dry.  Capillary refill takes less than 2 seconds.  8cm x 6cm area of erythema to the left forearm on the palmar aspect with 2 central pustular areas. Tenderness, swelling, and 1 red streak noted that goes to the elbow. No axillary nodes.   Psychiatric: He has a normal mood and affect.  Nursing note and vitals reviewed.    ED Treatments / Results  DIAGNOSTIC STUDIES:  Oxygen Saturation is 100% on RA, normal by my interpretation.    COORDINATION OF CARE:  4:25 PM Discussed treatment plan with pt at bedside and pt agreed to plan.   Labs (all  labs ordered are listed, but only abnormal results are displayed) Labs Reviewed - No data to display   Radiology No results found.  Procedures Procedures (including critical care time)  Medications Ordered in ED Medications  doxycycline (VIBRA-TABS) tablet 100 mg (100 mg Oral Given 07/12/16 1642)  ondansetron (ZOFRAN-ODT) disintegrating tablet 4 mg (4 mg Oral Given 07/12/16 1642)  ibuprofen (ADVIL,MOTRIN) tablet 600 mg (600 mg Oral Given 07/12/16 1642)     Initial Impression / Assessment and Plan / ED Course  30 y.o. male with areas consistent with local reaction and  infected insect bites. Will treat for cellulitis and patient instructed to f/u in the next day or two with his PCP or return here for worsening symptoms. Return precautions given. Will start antibiotics.   I have reviewed the triage vital signs and the nursing notes.  Clinical Course     Final Clinical Impressions(s) / ED Diagnoses   Final diagnoses:  Bug bite with infection, initial encounter    New Prescriptions Discharge Medication List as of 07/12/2016  4:30 PM    START taking these medications   Details  diphenhydrAMINE (BENADRYL) 25 MG tablet Take 1 tablet (25 mg total) by mouth every 6 (six) hours., Starting Fri 07/12/2016, Print    doxycycline (VIBRAMYCIN) 100 MG capsule Take 1 capsule (100 mg total) by mouth 2 (two) times daily., Starting Fri 07/12/2016, Print    ibuprofen (ADVIL,MOTRIN) 600 MG tablet Take 1 tablet (600 mg total) by mouth every 6 (six) hours as needed., Starting Fri 07/12/2016, Print      *I personally performed the services described in this documentation, which was scribed in my presence. The recorded information has been reviewed and is accurate.     [address] August[address] StreetHope Colorado CityM Morrie Daywalt, [place]NP 07/15/16 [id number]    Lyndal Pulleyaniel Knott, MD 07/16/16 [phone]

## 2016-07-12 NOTE — ED Triage Notes (Addendum)
Pt presents with c/o skin problem. He has 2 red bumps to L anterior forearm with redness and swelling. The bumps are painful and itchy. He noticed them after waking today. He is worried a bug may have bitten him.

## 2017-06-09 ENCOUNTER — Encounter (HOSPITAL_COMMUNITY): Payer: Self-pay | Admitting: *Deleted

## 2017-06-09 ENCOUNTER — Emergency Department (HOSPITAL_COMMUNITY)
Admission: EM | Admit: 2017-06-09 | Discharge: 2017-06-09 | Disposition: A | Discharge status: Home / Self Care | Payer: Managed Care, Other (non HMO) | Attending: Emergency Medicine | Admitting: Emergency Medicine

## 2017-06-09 DIAGNOSIS — X58XXXA Exposure to other specified factors, initial encounter: Secondary | ICD-10-CM | POA: Insufficient documentation

## 2017-06-09 DIAGNOSIS — S0500XA Injury of conjunctiva and corneal abrasion without foreign body, unspecified eye, initial encounter: Secondary | ICD-10-CM | POA: Insufficient documentation

## 2017-06-09 DIAGNOSIS — Y939 Activity, unspecified: Secondary | ICD-10-CM | POA: Insufficient documentation

## 2017-06-09 DIAGNOSIS — Y999 Unspecified external cause status: Secondary | ICD-10-CM | POA: Insufficient documentation

## 2017-06-09 DIAGNOSIS — Y929 Unspecified place or not applicable: Secondary | ICD-10-CM | POA: Insufficient documentation

## 2017-06-09 DIAGNOSIS — J45909 Unspecified asthma, uncomplicated: Secondary | ICD-10-CM | POA: Insufficient documentation

## 2017-06-09 DIAGNOSIS — F17200 Nicotine dependence, unspecified, uncomplicated: Secondary | ICD-10-CM | POA: Insufficient documentation

## 2017-06-09 MED ORDER — FLUORESCEIN SODIUM 1 MG OP STRP
1.0000 | ORAL_STRIP | Freq: Once | OPHTHALMIC | Status: AC
  Administered 2017-06-09: 1 via OPHTHALMIC
  Filled 2017-06-09: qty 1

## 2017-06-09 MED ORDER — KETOROLAC TROMETHAMINE 0.5 % OP SOLN
1.0000 [drp] | Freq: Once | OPHTHALMIC | Status: AC
  Administered 2017-06-09: 1 [drp] via OPHTHALMIC
  Filled 2017-06-09: qty 3

## 2017-06-09 MED ORDER — SULFACETAMIDE SODIUM 10 % OP SOLN
2.0000 [drp] | Freq: Once | OPHTHALMIC | Status: AC
  Administered 2017-06-09: 2 [drp] via OPHTHALMIC
  Filled 2017-06-09: qty 15

## 2017-06-09 MED ORDER — TETRACAINE HCL 0.5 % OP SOLN
2.0000 [drp] | Freq: Once | OPHTHALMIC | Status: AC
  Administered 2017-06-09: 2 [drp] via OPHTHALMIC
  Filled 2017-06-09: qty 8

## 2017-06-09 NOTE — ED Triage Notes (Signed)
Pt reports bilateral eye redness, pain and drainage since yesterday.

## 2017-06-09 NOTE — ED Provider Notes (Signed)
[facility] EMERGENCY DEPARTMENT Provider Note   CSN: [id number] Arrival date & time: 06/09/17  1447     History   Chief Complaint Chief Complaint  Patient presents with  . Eye Problem    HPI Rodney White is a 31 y.o. male.  He presents for evaluation of bilateral eye pain with swelling and ability to open eyes secondary to pain.  He also complains of drainage from both eyes.  The discomfort started yesterday.  There is been no known trauma.  He denies fever, chills, nausea, vomiting, weakness or dizziness.  No prior similar problem.  There are no other known modifying factors.  HPI  Past Medical History:  Diagnosis Date  . Asthma     There are no active problems to display for this patient.   History reviewed. No pertinent surgical history.     Home Medications    Prior to Admission medications   Medication Sig Start Date End Date Taking? Authorizing Provider  dicyclomine (BENTYL) 20 MG tablet Take 1 tablet (20 mg total) by mouth 2 (two) times daily as needed for spasms. 08/31/14   Rodney Racer, MD  diphenhydrAMINE (BENADRYL) 25 MG tablet Take 1 tablet (25 mg total) by mouth every 6 (six) hours. 07/12/16   Rodney Napoleon, NP  doxycycline (VIBRAMYCIN) 100 MG capsule Take 1 capsule (100 mg total) by mouth 2 (two) times daily. 07/12/16   Rodney Napoleon, NP  ibuprofen (ADVIL,MOTRIN) 600 MG tablet Take 1 tablet (600 mg total) by mouth every 6 (six) hours as needed. 07/12/16   Rodney Napoleon, NP  ondansetron (ZOFRAN ODT) 4 MG disintegrating tablet 4mg  ODT q4 hours prn nausea/vomit 08/31/14   Rodney Racer, MD    Family History History reviewed. No pertinent family history.  Social History Social History  Substance Use Topics  . Smoking status: Current Every Day Smoker  . Smokeless tobacco: Never Used  . Alcohol use Yes     Allergies   Patient has no known allergies.   Review of Systems Review of Systems  All other systems reviewed and  are negative.    Physical Exam Updated Vital Signs BP 130/78 (BP Location: Right Arm)   Pulse (!) 105   Temp 99.2 F (37.3 C) (Oral)   Resp 18   SpO2 98%   Physical Exam  Constitutional: He is oriented to person, place, and time. He appears well-developed and well-nourished. No distress.  HENT:  Head: Normocephalic and atraumatic.  Right Ear: External ear normal.  Left Ear: External ear normal.  Eyes: Pupils are equal, round, and reactive to light. EOM are normal.  Bilateral conjunctival injection.  No hyphema.  No purulent drainage.  External ocular muscles are intact, without painful motion.  No swelling of the lids.  Neck: Normal range of motion and phonation normal. Neck supple.  Cardiovascular: Normal rate.   Pulmonary/Chest: Effort normal. He exhibits no bony tenderness.  Musculoskeletal: Normal range of motion.  Neurological: He is alert and oriented to person, place, and time. No cranial nerve deficit or sensory deficit. He exhibits normal muscle tone. Coordination normal.  Skin: Skin is warm, dry and intact.  Psychiatric: He has a normal mood and affect. His behavior is normal. Judgment and thought content normal.  Nursing note and vitals reviewed.    ED Treatments / Results  Labs (all labs ordered are listed, but only abnormal results are displayed) Labs Reviewed - No data to display  EKG  EKG Interpretation None  Radiology No results found.  Procedures Procedures (including critical care time)  Medications Ordered in ED Medications  fluorescein ophthalmic strip 1 strip (1 strip Both Eyes Given 06/09/17 1719)  tetracaine (PONTOCAINE) 0.5 % ophthalmic solution 2 drop (2 drops Both Eyes Given 06/09/17 1719)  ketorolac (ACULAR) 0.5 % ophthalmic solution 1 drop (1 drop Both Eyes Given 06/09/17 1902)  sulfacetamide (BLEPH-10) 10 % ophthalmic solution 2 drop (2 drops Both Eyes Given 06/09/17 1903)     Initial Impression / Assessment and Plan / ED  Course  I have reviewed the triage vital signs and the nursing notes.  Pertinent labs & imaging results that were available during my care of the patient were reviewed by me and considered in my medical decision making (see chart for details).      Patient Vitals for the past 24 hrs:  BP Temp Temp src Pulse Resp SpO2  06/09/17 1523 130/78 99.2 F (37.3 C) Oral (!) 105 18 98 %    18: 45-eye examination: Tetracaine analgesia both eyes, by me Fluorescein instilled by me Evaluation was light indicates bilateral lower bulbar abrasions, anteriorly.  No foreign body.  7:19 PM Reevaluation with update and discussion. After initial assessment and treatment, an updated evaluation reveals no change in clinical status.  Findings discussed with the patient and all questions were answered. Rodney White      Final Clinical Impressions(s) / ED Diagnoses   Final diagnoses:  Corneal abrasion, unspecified laterality, initial encounter   Red eyes, with signs and symptoms of conjunctivitis as well as possible secondary or primary abrasions.  Doubt serious bacterial infection, invasive corneal injury or infection, or systemic involvement.  Nursing Notes Reviewed/ Care Coordinated Applicable Imaging Reviewed Interpretation of Laboratory Data incorporated into ED treatment  The patient appears reasonably screened and/or stabilized for discharge and I doubt any other medical condition or other [facility] requiring further screening, evaluation, or treatment in the ED at this time prior to discharge.  Plan: Home Medications-OTC analgesia, use ketorolac and sulfacetamide, as directed; Home Treatments-warm compress as needed; return here if the recommended treatment, does not improve the symptoms; Recommended follow up-PCP, PRN    New Prescriptions New Prescriptions   No medications on file     Rodney White, Brodey Bonn, MD 06/09/17 Ernestina [id number]

## 2017-06-09 NOTE — Discharge Instructions (Signed)
There is irritation of your eyes, from abrasions, and inflammation.  Try to avoid rubbing her eyes.  Use a warm compress on the eyes 3 or 4 times a day, to help the discomfort.  Use the ketorolac drops, one in each eye 4 times a day.  Use the sulfacetamide drops, 2 in both eyes, every 4 hours.  Return here or see the doctor of your choice as needed for problems.

## 2017-06-11 ENCOUNTER — Emergency Department (HOSPITAL_COMMUNITY): Payer: Managed Care, Other (non HMO)

## 2017-06-11 ENCOUNTER — Emergency Department (HOSPITAL_COMMUNITY)
Admission: EM | Admit: 2017-06-11 | Discharge: 2017-06-12 | Disposition: A | Discharge status: Home / Self Care | Payer: Managed Care, Other (non HMO) | Attending: Emergency Medicine | Admitting: Emergency Medicine

## 2017-06-11 ENCOUNTER — Encounter (HOSPITAL_COMMUNITY): Payer: Self-pay | Admitting: Emergency Medicine

## 2017-06-11 DIAGNOSIS — Z79899 Other long term (current) drug therapy: Secondary | ICD-10-CM | POA: Insufficient documentation

## 2017-06-11 DIAGNOSIS — L03213 Periorbital cellulitis: Secondary | ICD-10-CM

## 2017-06-11 DIAGNOSIS — F17200 Nicotine dependence, unspecified, uncomplicated: Secondary | ICD-10-CM | POA: Insufficient documentation

## 2017-06-11 DIAGNOSIS — J45909 Unspecified asthma, uncomplicated: Secondary | ICD-10-CM | POA: Insufficient documentation

## 2017-06-11 MED ORDER — TETRACAINE HCL 0.5 % OP SOLN
1.0000 [drp] | Freq: Once | OPHTHALMIC | Status: AC
  Administered 2017-06-12: 1 [drp] via OPHTHALMIC
  Filled 2017-06-11: qty 4

## 2017-06-11 MED ORDER — FLUORESCEIN SODIUM 1 MG OP STRP
1.0000 | ORAL_STRIP | Freq: Once | OPHTHALMIC | Status: AC
  Administered 2017-06-12: 1 via OPHTHALMIC
  Filled 2017-06-11: qty 1

## 2017-06-11 MED ORDER — IOPAMIDOL (ISOVUE-300) INJECTION 61%
INTRAVENOUS | Status: AC
  Administered 2017-06-11: 75 mL
  Filled 2017-06-11: qty 75

## 2017-06-11 NOTE — ED Triage Notes (Signed)
Pt c/o bilateral eye pain and drainage x 1 week. Pt seen Monday and diagnosed with corneal abrasions. Reports eye drops are not helping.

## 2017-06-11 NOTE — ED Provider Notes (Signed)
[facility] EMERGENCY DEPARTMENT Provider Note   CSN: [id number] Arrival date & time: 06/11/17  1859     History   Chief Complaint Chief Complaint  Patient presents with  . Eye Pain    HPI Rodney White is a 31 y.o. male.  HPI 31 year old male who presents with bilateral eye pain and swelling. Seen 2 days ago in ED for bilateral eye redness and discharge. Diagnosed with bacterial conjunctivitis. Discharged with ophthalmic antibiotics and toradol. Noticed more pain and swelling to the eye. Increased eye discharge. Denies trauma. Thinks his symptoms have gotten worse. Vision blurry, but thinks it relates to increased discharge. No fever or chills. No nausea or vomiting. Denies contact lens or glasses usage.  Past Medical History:  Diagnosis Date  . Asthma     There are no active problems to display for this patient.   History reviewed. No pertinent surgical history.     Home Medications    Prior to Admission medications   Medication Sig Start Date End Date Taking? Authorizing Provider  amoxicillin-clavulanate (AUGMENTIN) 875-125 MG tablet Take 1 tablet by mouth every 12 (twelve) hours. 06/12/17   Lavera Guise, MD  dicyclomine (BENTYL) 20 MG tablet Take 1 tablet (20 mg total) by mouth 2 (two) times daily as needed for spasms. 08/31/14   Loren Racer, MD  diphenhydrAMINE (BENADRYL) 25 MG tablet Take 1 tablet (25 mg total) by mouth every 6 (six) hours. 07/12/16   Janne Napoleon, NP  doxycycline (VIBRAMYCIN) 100 MG capsule Take 1 capsule (100 mg total) by mouth 2 (two) times daily. 07/12/16   Janne Napoleon, NP  HYDROcodone-acetaminophen (NORCO/VICODIN) 5-325 MG tablet Take 1-2 tablets by mouth every 6 (six) hours as needed for moderate pain or severe pain. 06/12/17   Lavera Guise, MD  ibuprofen (ADVIL,MOTRIN) 600 MG tablet Take 1 tablet (600 mg total) by mouth every 6 (six) hours as needed. 07/12/16   Janne Napoleon, NP  ondansetron (ZOFRAN ODT) 4 MG  disintegrating tablet 4mg  ODT q4 hours prn nausea/vomit 08/31/14   Loren Racer, MD  sulfamethoxazole-trimethoprim (BACTRIM DS,SEPTRA DS) 800-160 MG tablet Take 1 tablet by mouth 2 (two) times daily. 06/12/17 06/19/17  Lavera Guise, MD    Family History No family history on file.  Social History Social History  Substance Use Topics  . Smoking status: Current Every Day Smoker  . Smokeless tobacco: Never Used  . Alcohol use Yes     Allergies   Patient has no known allergies.   Review of Systems Review of Systems  Constitutional: Negative for fever.  Eyes: Positive for pain and redness. Negative for photophobia.  Gastrointestinal: Negative for nausea and vomiting.  Allergic/Immunologic: Negative for immunocompromised state.  Neurological: Negative for headaches.  Hematological: Does not bruise/bleed easily.     Physical Exam Updated Vital Signs BP 123/81 (BP Location: Left Arm)   Pulse 100   Temp 99.1 F (37.3 C) (Oral)   Ht 5\' 6"  (1.676 m)   Wt 74.8 kg (165 lb)   SpO2 94%   BMI 26.63 kg/m   Physical Exam Physical Exam  Constitutional: Appears well-developed and well-nourished. No acute distress. HENT:  Head: Normocephalic. Atraumatic.  Eyes: Mild bilateral eyelid erythema and swelling. There is right > eyelid edema, erythema, warmth. Bilateral eye discharge, mucopurulent. EOMI but with reported pain. PERRL. Significant conjunctival injection and mild chemosis. No hypopyon or hyphema. Cardiovascular: Normal rate and intact distal pulses.   Pulmonary/Chest: Effort normal.  No respiratory distress.  Abdominal: Exhibits no distension.  Musculoskeletal: Normal range of motion. Exhibits no deformity.  Neurological: Alert. Fluent speech.  Skin: Skin is warm and dry.  Psychiatric: Normal mood and affect. Behavior is normal.  Nursing note and vitals reviewed.   ED Treatments / Results  Labs (all labs ordered are listed, but only abnormal results are  displayed) Labs Reviewed - No data to display  EKG  EKG Interpretation None       Radiology Ct Orbits W Contrast  Result Date: 06/12/2017 CLINICAL DATA:  Back pain and orbital cellulitis for 3 days. EXAM: CT ORBITS WITH CONTRAST TECHNIQUE: Multidetector CT images was performed according to the standard protocol following intravenous contrast administration. CONTRAST:  75 cc ISOVUE-300 IOPAMIDOL (ISOVUE-300) INJECTION 61% COMPARISON:  None. FINDINGS: ORBITS: Mild RIGHT, minimal suspected LEFT preseptal soft tissue swelling. No subcutaneous gas, radiopaque foreign bodies. 5 mm focal fluid collection lateral RIGHT preseptal soft tissues. Intact ocular globes. Lenses are located. Normal appearance of the optic nerve sheath complexes. Preservation of the orbital fat. Normal appearance of the extraocular muscles which are well located. Superior ophthalmic veins are not enlarged. SINUSES: Scattered small mucosal retention cysts. RIGHT greater than LEFT sphenoethmoidal mucosal thickening. Mastoid air cells are well aerated. INTRACRANIAL CONTENTS: Normal. OSSEOUS STRUCTURES/ SOFT TISSUES: No significant soft tissue swelling, no subcutaneous gas or radiopaque foreign bodies. No destructive bony lesions. IMPRESSION: 1. Mild RIGHT and suspected LEFT periorbital cellulitis. 5 mm RIGHT preseptal abscess. No postseptal inflammation/infection. Electronically Signed   By: Awilda Metroourtnay  Bloomer M.D.   On: 06/12/2017 00:25    Procedures Procedures (including critical care time)  Medications Ordered in ED Medications  tetracaine (PONTOCAINE) 0.5 % ophthalmic solution 1 drop (not administered)  fluorescein ophthalmic strip 1 strip (not administered)  HYDROcodone-acetaminophen (NORCO/VICODIN) 5-325 MG per tablet 1 tablet (not administered)  amoxicillin-clavulanate (AUGMENTIN) 875-125 MG per tablet 1 tablet (not administered)  sulfamethoxazole-trimethoprim (BACTRIM DS,SEPTRA DS) 800-160 MG per tablet 1 tablet (not  administered)  iopamidol (ISOVUE-300) 61 % injection (75 mLs  Contrast Given 06/11/17 2352)     Initial Impression / Assessment and Plan / ED Course  I have reviewed the triage vital signs and the nursing notes.  Pertinent labs & imaging results that were available during my care of the patient were reviewed by me and considered in my medical decision making (see chart for details).     Return with worsening eye pain and swelling. Non-toxic in appearance. Afebrile. HD stable. Compared to exam 2 days ago, he has some symptoms now of eyelid edema, eye pain with EOMI, and some worsening chemosis of the eye. No hypopyon. No apparent abrasions on exam with fluorescein. CT orbits obtained to rule out orbital cellulitis. There is evidence of bilateral preseptal cellulitis. No evidence of post septal cellulitis. There is also possible 5 mm abscess of the right preseptal abscess. Discussed with Dr. Allena KatzPatel from ophthalmology. Given very small nature of abscess, not fully perceptible on exam he recommended oral antibiotics. Patient will call his office tomorrow and arrange close follow-up appointment. Strict return and follow-up instructions reviewed. He expressed understanding of all discharge instructions and felt comfortable with the plan of care.   Final Clinical Impressions(s) / ED Diagnoses   Final diagnoses:  Preseptal cellulitis    New Prescriptions New Prescriptions   AMOXICILLIN-CLAVULANATE (AUGMENTIN) 875-125 MG TABLET    Take 1 tablet by mouth every 12 (twelve) hours.   HYDROCODONE-ACETAMINOPHEN (NORCO/VICODIN) 5-325 MG TABLET    Take 1-2 tablets by  mouth every 6 (six) hours as needed for moderate pain or severe pain.   SULFAMETHOXAZOLE-TRIMETHOPRIM (BACTRIM DS,SEPTRA DS) 800-160 MG TABLET    Take 1 tablet by mouth 2 (two) times daily.     Lavera Guise, MD 06/12/17 [phone]

## 2017-06-11 NOTE — ED Notes (Signed)
Pt here with bilat eye irritation, draining, swelling, pain; pt seen here couple days ago, dx with corneal abrasions and conjunctivitis, given pain relieving drops and antibiotic drops, pt states no improvement; pt guided back to pod E room as he states light makes pain worse

## 2017-06-11 NOTE — ED Notes (Signed)
Pt. To CT via stretcher. 

## 2017-06-12 MED ORDER — SULFAMETHOXAZOLE-TRIMETHOPRIM 800-160 MG PO TABS
1.0000 | ORAL_TABLET | Freq: Once | ORAL | Status: AC
  Administered 2017-06-12: 1 via ORAL
  Filled 2017-06-12: qty 1

## 2017-06-12 MED ORDER — SULFAMETHOXAZOLE-TRIMETHOPRIM 800-160 MG PO TABS: 1.0000 | ORAL_TABLET | Freq: Two times a day (BID) | ORAL | 0 refills | Status: AC

## 2017-06-12 MED ORDER — HYDROCODONE-ACETAMINOPHEN 5-325 MG PO TABS
1.0000 | ORAL_TABLET | Freq: Once | ORAL | Status: AC
  Administered 2017-06-12: 1 via ORAL
  Filled 2017-06-12: qty 1

## 2017-06-12 MED ORDER — AMOXICILLIN-POT CLAVULANATE 875-125 MG PO TABS: 1.0000 | ORAL_TABLET | Freq: Two times a day (BID) | ORAL | 0 refills | Status: DC

## 2017-06-12 MED ORDER — AMOXICILLIN-POT CLAVULANATE 875-125 MG PO TABS
1.0000 | ORAL_TABLET | Freq: Once | ORAL | Status: AC
  Administered 2017-06-12: 1 via ORAL
  Filled 2017-06-12: qty 1

## 2017-06-12 MED ORDER — HYDROCODONE-ACETAMINOPHEN 5-325 MG PO TABS: 1.0000 | ORAL_TABLET | Freq: Four times a day (QID) | ORAL | 0 refills | Status: DC | PRN

## 2017-06-12 NOTE — Discharge Instructions (Signed)
Your CT shows that you have a cellulitis or skin infection around the eye. Please call ophthalmologist's office tomorrow morning for re-recheck. Take antibiotics as prescribed.  Return for worsening symptoms, including escalating pain, worsening swelling, fever, or any other symptoms concerning to you.

## 2017-06-12 NOTE — ED Notes (Signed)
Pt. Return via. Stretcher.

## 2017-11-25 ENCOUNTER — Emergency Department (HOSPITAL_COMMUNITY): Payer: Managed Care, Other (non HMO)

## 2017-11-25 ENCOUNTER — Emergency Department (HOSPITAL_COMMUNITY)
Admission: EM | Admit: 2017-11-25 | Discharge: 2017-11-25 | Disposition: A | Discharge status: Home / Self Care | Payer: Managed Care, Other (non HMO) | Attending: Emergency Medicine | Admitting: Emergency Medicine

## 2017-11-25 ENCOUNTER — Other Ambulatory Visit: Payer: Self-pay

## 2017-11-25 ENCOUNTER — Encounter (HOSPITAL_COMMUNITY): Payer: Self-pay

## 2017-11-25 DIAGNOSIS — Z79899 Other long term (current) drug therapy: Secondary | ICD-10-CM | POA: Insufficient documentation

## 2017-11-25 DIAGNOSIS — B9789 Other viral agents as the cause of diseases classified elsewhere: Secondary | ICD-10-CM | POA: Insufficient documentation

## 2017-11-25 DIAGNOSIS — F17210 Nicotine dependence, cigarettes, uncomplicated: Secondary | ICD-10-CM | POA: Insufficient documentation

## 2017-11-25 DIAGNOSIS — J45909 Unspecified asthma, uncomplicated: Secondary | ICD-10-CM | POA: Insufficient documentation

## 2017-11-25 DIAGNOSIS — R1110 Vomiting, unspecified: Secondary | ICD-10-CM

## 2017-11-25 DIAGNOSIS — J069 Acute upper respiratory infection, unspecified: Secondary | ICD-10-CM | POA: Insufficient documentation

## 2017-11-25 MED ORDER — ONDANSETRON HCL 4 MG PO TABS: 4.0000 mg | ORAL_TABLET | Freq: Three times a day (TID) | ORAL | 0 refills | Status: DC | PRN

## 2017-11-25 MED ORDER — BENZONATATE 100 MG PO CAPS: 100.0000 mg | ORAL_CAPSULE | Freq: Three times a day (TID) | ORAL | 0 refills | Status: DC | PRN

## 2017-11-25 NOTE — Discharge Instructions (Addendum)
Drink plenty of water and get plenty of rest. Gargle warm salt water and spit it out for sore throat. May also use cough drops, warm teas, etc. Take Tessalon for cough as needed. Take Zofran as needed for nausea and vomiting. Alternate 600 mg of ibuprofen and [id number] mg of Tylenol every 3 hours as needed for pain. Do not exceed 4000 mg of Tylenol daily.   Followup with your primary care doctor in 3-5 days for recheck of ongoing symptoms. Return to emergency department for emergent changing or worsening of symptoms such as throat tightness, facial swelling, fever not controlled by ibuprofen or Tylenol,difficulty breathing, or chest pain.

## 2017-11-25 NOTE — ED Notes (Signed)
Pt reports he has had NV with headache and sore throat for 3 days. Denies getting flu shot this year.

## 2017-11-25 NOTE — ED Triage Notes (Signed)
Patient complains of 3 days of cough, congestion, low graded fever. Alert and oriented, NAD

## 2017-11-25 NOTE — ED Provider Notes (Signed)
[facility] EMERGENCY DEPARTMENT Provider Note   CSN: [id number] Arrival date & time: 11/25/17  7829     History   Chief Complaint Chief Complaint  Patient presents with  . cough/congestion    HPI Rodney White is a 32 y.o. male with history of asthma presents today for evaluation of acute onset, persistent nonproductive cough for 3 days.he notes an aching sensation when he coughs in his chest wall anteriorly and posteriorly. He notes 4 episodes of nonbloody nonbilious posttussive emesis. He denies abdominal pain, urinary symptoms, diarrhea, or constipation. He denies shortness of breath. He notes mild nasal congestion but denies sore throat, headaches, fevers, or chills but was told that he had a low-grade fever in the ED today.he has not tried anything for his symptoms. He has maximal to multiple sick contacts at work.he is a current smoker of approximately half pack of cigarettes daily.  The history is provided by the patient.    Past Medical History:  Diagnosis Date  . Asthma     There are no active problems to display for this patient.   History reviewed. No pertinent surgical history.      Home Medications    Prior to Admission medications   Medication Sig Start Date End Date Taking? Authorizing Provider  amoxicillin-clavulanate (AUGMENTIN) 875-125 MG tablet Take 1 tablet by mouth every 12 (twelve) hours. 06/12/17   Lavera Guise, MD  benzonatate (TESSALON) 100 MG capsule Take 1 capsule (100 mg total) by mouth 3 (three) times daily as needed for cough. 11/25/17   Keymora Grillot A, PA-C  dicyclomine (BENTYL) 20 MG tablet Take 1 tablet (20 mg total) by mouth 2 (two) times daily as needed for spasms. 08/31/14   Loren Racer, MD  diphenhydrAMINE (BENADRYL) 25 MG tablet Take 1 tablet (25 mg total) by mouth every 6 (six) hours. 07/12/16   Janne Napoleon, NP  doxycycline (VIBRAMYCIN) 100 MG capsule Take 1 capsule (100 mg total) by mouth 2 (two) times daily.  07/12/16   Janne Napoleon, NP  HYDROcodone-acetaminophen (NORCO/VICODIN) 5-325 MG tablet Take 1-2 tablets by mouth every 6 (six) hours as needed for moderate pain or severe pain. 06/12/17   Lavera Guise, MD  ibuprofen (ADVIL,MOTRIN) 600 MG tablet Take 1 tablet (600 mg total) by mouth every 6 (six) hours as needed. 07/12/16   Janne Napoleon, NP  ondansetron (ZOFRAN ODT) 4 MG disintegrating tablet 4mg  ODT q4 hours prn nausea/vomit 08/31/14   Loren Racer, MD  ondansetron (ZOFRAN) 4 MG tablet Take 1 tablet (4 mg total) by mouth every 8 (eight) hours as needed for nausea or vomiting. 11/25/17   Jeanie Sewer, PA-C    Family History No family history on file.  Social History Social History   Tobacco Use  . Smoking status: Current Every Day Smoker  . Smokeless tobacco: Never Used  Substance Use Topics  . Alcohol use: Yes  . Drug use: Yes    Types: Marijuana     Allergies   Patient has no known allergies.   Review of Systems Review of Systems  Constitutional: Negative for chills and fever.  HENT: Positive for congestion. Negative for sore throat.   Respiratory: Positive for cough. Negative for shortness of breath.   Cardiovascular: Positive for chest pain (with cough only).  Gastrointestinal: Positive for nausea and vomiting. Negative for abdominal pain, constipation and diarrhea.  Neurological: Negative for syncope, weakness and headaches.     Physical Exam Updated Vital Signs  BP 128/81 (BP Location: Right Arm)   Pulse 93   Temp 98.2 F (36.8 C) (Oral)   Resp 18   SpO2 98%   Physical Exam  Constitutional: He appears well-developed and well-nourished. No distress.  HENT:  Head: Normocephalic and atraumatic.  Right Ear: External ear normal.  Left Ear: External ear normal.  Mouth/Throat: Oropharynx is clear and moist.  Nasal septum midline, no mucosal edema. No frontomaxillary sinus tenderness. TMs without erythema or bulging bilaterally. Posterior oropharynx without  tonsillar hypertrophy, erythema, exudates, or uvular deviation. No trismus or sublingual abnormalities.  Eyes: Pupils are equal, round, and reactive to light. Conjunctivae and EOM are normal. Right eye exhibits no discharge. Left eye exhibits no discharge.  Neck: Normal range of motion. Neck supple. No JVD present. No tracheal deviation present.  Cardiovascular: Normal rate, regular rhythm, normal heart sounds and intact distal pulses.  Pulmonary/Chest: Effort normal and breath sounds normal. No stridor. No respiratory distress. He has no wheezes. He has no rales. He exhibits no tenderness.  Equal rise and fall of chest, no increased work of breathing, speaking in full sentences without difficulty.  Abdominal: Soft. Bowel sounds are normal. He exhibits no distension. There is no tenderness.  Musculoskeletal: He exhibits no edema.  Lymphadenopathy:    He has no cervical adenopathy.  Neurological: He is alert.  Skin: Skin is warm and dry. No erythema.  Psychiatric: He has a normal mood and affect. His behavior is normal.  Nursing note and vitals reviewed.    ED Treatments / Results  Labs (all labs ordered are listed, but only abnormal results are displayed) Labs Reviewed - No data to display  EKG None  Radiology Dg Chest 2 View  Result Date: 11/25/2017 CLINICAL DATA:  Cough, sometimes vomiting after cough, fever and body aches over the last 2 days, smoking history EXAM: CHEST - 2 VIEW COMPARISON:  None. FINDINGS: No pneumonia or effusion is seen. Bronchitis cannot be excluded. Mediastinal and hilar contours are unremarkable. The heart is within normal limits in size. No bony abnormality is seen. IMPRESSION: No pneumonia or effusion.  Cannot exclude bronchitis. Electronically Signed   By: Dwyane Dee M.D.   On: 11/25/2017 10:31    Procedures Procedures (including critical care time)  Medications Ordered in ED Medications - No data to display   Initial Impression / Assessment and  Plan / ED Course  I have reviewed the triage vital signs and the nursing notes.  Pertinent labs & imaging results that were available during my care of the patient were reviewed by me and considered in my medical decision making (see chart for details).     Pt CXR negative for acute infiltrate though shows possible bronchitis. He is afebrile, initially mildly tachycardic with return to baseline on reevaluation. I highly doubt PE.pain does not appear to be cardiac in etiology. Emesis is posttussive in nature and I doubt acute intra-abdominal abnormality.Patients symptoms are consistent with URI, likely viral etiology. Discussed that antibiotics are not indicated for viral infections. Pt will be discharged with symptomatic treatment.  Verbalizes understanding and is agreeable with plan. Pt is hemodynamically stable & in NAD prior to dc.    Final Clinical Impressions(s) / ED Diagnoses   Final diagnoses:  Viral URI with cough  Post-tussive emesis    ED Discharge Orders        Ordered    benzonatate (TESSALON) 100 MG capsule  3 times daily PRN     11/25/17 1352  ondansetron (ZOFRAN) 4 MG tablet  Every 8 hours PRN     11/25/17 1352       Jeanie SewerFawze, Chilton Sallade A, PA-C 11/25/17 1356    Linwood DibblesKnapp, Jon, MD 11/26/17 [phone]

## 2017-11-25 NOTE — ED Notes (Signed)
Pt called for room assignment. No answer. Will call again.

## 2018-03-31 ENCOUNTER — Emergency Department (HOSPITAL_COMMUNITY)
Admission: EM | Admit: 2018-03-31 | Discharge: 2018-03-31 | Disposition: A | Discharge status: Home / Self Care | Payer: Self-pay | Attending: Emergency Medicine | Admitting: Emergency Medicine

## 2018-03-31 ENCOUNTER — Encounter (HOSPITAL_COMMUNITY): Payer: Self-pay

## 2018-03-31 DIAGNOSIS — F17200 Nicotine dependence, unspecified, uncomplicated: Secondary | ICD-10-CM | POA: Insufficient documentation

## 2018-03-31 DIAGNOSIS — L509 Urticaria, unspecified: Secondary | ICD-10-CM | POA: Insufficient documentation

## 2018-03-31 DIAGNOSIS — J45909 Unspecified asthma, uncomplicated: Secondary | ICD-10-CM | POA: Insufficient documentation

## 2018-03-31 DIAGNOSIS — T7840XA Allergy, unspecified, initial encounter: Secondary | ICD-10-CM | POA: Insufficient documentation

## 2018-03-31 MED ORDER — EPINEPHRINE 0.3 MG/0.3ML IJ SOAJ
INTRAMUSCULAR | Status: AC
  Filled 2018-03-31: qty 0.3

## 2018-03-31 MED ORDER — FAMOTIDINE IN NACL 20-0.9 MG/50ML-% IV SOLN
20.0000 mg | Freq: Once | INTRAVENOUS | Status: AC
  Administered 2018-03-31: 20 mg via INTRAVENOUS
  Filled 2018-03-31: qty 50

## 2018-03-31 MED ORDER — PREDNISONE 10 MG PO TABS: ORAL_TABLET | ORAL | 0 refills | Status: DC

## 2018-03-31 MED ORDER — EPINEPHRINE 0.3 MG/0.3ML IJ SOAJ: 0.3000 mg | Freq: Once | INTRAMUSCULAR | 0 refills | Status: AC

## 2018-03-31 MED ORDER — METHYLPREDNISOLONE SODIUM SUCC 125 MG IJ SOLR
125.0000 mg | Freq: Once | INTRAMUSCULAR | Status: AC
  Administered 2018-03-31: 125 mg via INTRAVENOUS
  Filled 2018-03-31: qty 2

## 2018-03-31 MED ORDER — DIPHENHYDRAMINE HCL 50 MG/ML IJ SOLN
25.0000 mg | Freq: Once | INTRAMUSCULAR | Status: AC
  Administered 2018-03-31: 25 mg via INTRAVENOUS
  Filled 2018-03-31: qty 1

## 2018-03-31 MED ORDER — DIPHENHYDRAMINE HCL 25 MG PO TABS: 25.0000 mg | ORAL_TABLET | Freq: Four times a day (QID) | ORAL | 0 refills | Status: AC

## 2018-03-31 MED ORDER — EPINEPHRINE 0.3 MG/0.3ML IJ SOAJ
0.3000 mg | Freq: Once | INTRAMUSCULAR | Status: AC
  Administered 2018-03-31: 0.3 mg via SUBCUTANEOUS

## 2018-03-31 MED ORDER — FAMOTIDINE 20 MG PO TABS: 20.0000 mg | ORAL_TABLET | Freq: Two times a day (BID) | ORAL | 0 refills | Status: DC

## 2018-03-31 NOTE — ED Notes (Signed)
Pt denies any sob ; no hives noted ,pt states " I feel a lot better at this time "

## 2018-03-31 NOTE — ED Triage Notes (Signed)
Pt states that he woke up tonight with hives all over, feels SOB, CP, palpations, pt states he feels like his face is swelling. Vomited x 4, lung sounds clear

## 2018-03-31 NOTE — ED Provider Notes (Signed)
[facility] Provider Note   CSN: [id number] Arrival date & time: 03/31/18  [id number]     History   Chief Complaint Chief Complaint  Patient presents with  . Allergic Reaction    HPI Olga MillersBrandon Duval is a 32 y.o. male.  Patient presents with acute onset hives that woke him from sleep this morning. Since then he has developed facial swelling around eyes and "feels funny to swallow". He has no history of previous similar reactions. He has a history of asthma but denies any sense of wheezing currently. He did not take anything prior to arrival for symptoms.   The history is provided by the patient. No language interpreter was used.  Allergic Reaction  Presenting symptoms: difficulty swallowing (See HPI.) and rash   Presenting symptoms: no wheezing     Past Medical History:  Diagnosis Date  . Asthma     There are no active problems to display for this patient.   History reviewed. No pertinent surgical history.      Home Medications    Prior to Admission medications   Medication Sig Start Date End Date Taking? Authorizing Provider  amoxicillin-clavulanate (AUGMENTIN) 875-125 MG tablet Take 1 tablet by mouth every 12 (twelve) hours. Patient not taking: Reported on 03/31/2018 06/12/17   Lavera GuiseLiu, Dana Duo, MD  benzonatate (TESSALON) 100 MG capsule Take 1 capsule (100 mg total) by mouth 3 (three) times daily as needed for cough. Patient not taking: Reported on 03/31/2018 11/25/17   Michela PitcherFawze, Mina A, PA-C  dicyclomine (BENTYL) 20 MG tablet Take 1 tablet (20 mg total) by mouth 2 (two) times daily as needed for spasms. Patient not taking: Reported on 03/31/2018 08/31/14   Loren RacerYelverton, David, MD  diphenhydrAMINE (BENADRYL) 25 MG tablet Take 1 tablet (25 mg total) by mouth every 6 (six) hours. Patient not taking: Reported on 03/31/2018 07/12/16   Janne NapoleonNeese, Hope M, NP  doxycycline (VIBRAMYCIN) 100 MG capsule Take 1 capsule (100 mg total) by mouth 2 (two) times  daily. Patient not taking: Reported on 03/31/2018 07/12/16   Janne NapoleonNeese, Hope M, NP  HYDROcodone-acetaminophen (NORCO/VICODIN) 5-325 MG tablet Take 1-2 tablets by mouth every 6 (six) hours as needed for moderate pain or severe pain. Patient not taking: Reported on 03/31/2018 06/12/17   Lavera GuiseLiu, Dana Duo, MD  ibuprofen (ADVIL,MOTRIN) 600 MG tablet Take 1 tablet (600 mg total) by mouth every 6 (six) hours as needed. Patient not taking: Reported on 03/31/2018 07/12/16   Janne NapoleonNeese, Hope M, NP  ondansetron (ZOFRAN ODT) 4 MG disintegrating tablet 4mg  ODT q4 hours prn nausea/vomit Patient not taking: Reported on 03/31/2018 08/31/14   Loren RacerYelverton, David, MD  ondansetron (ZOFRAN) 4 MG tablet Take 1 tablet (4 mg total) by mouth every 8 (eight) hours as needed for nausea or vomiting. Patient not taking: Reported on 03/31/2018 11/25/17   Jeanie SewerFawze, Mina A, PA-C    Family History No family history on file.  Social History Social History   Tobacco Use  . Smoking status: Current Every Day Smoker  . Smokeless tobacco: Never Used  Substance Use Topics  . Alcohol use: Yes  . Drug use: Yes    Types: Marijuana     Allergies   Patient has no known allergies.   Review of Systems Review of Systems  HENT: Positive for facial swelling and trouble swallowing (See HPI.). Negative for voice change.   Respiratory: Negative.  Negative for wheezing and stridor.   Cardiovascular: Negative.  Negative for chest pain.  Gastrointestinal:  Negative.  Negative for nausea and vomiting.  Musculoskeletal: Negative.   Skin: Positive for rash.  Neurological: Negative.  Negative for syncope and weakness.     Physical Exam Updated Vital Signs BP (!) 124/95   Pulse 96   Temp 99.2 F (37.3 C)   Resp 16   SpO2 95%   Physical Exam  Constitutional: He is oriented to person, place, and time. He appears well-developed and well-nourished.  HENT:  Head: Normocephalic.  Facial swelling affecting periorbital area. No lip or tongue swelling.  Oropharynx is benign.   Neck: Normal range of motion. Neck supple.  Cardiovascular: Normal rate and regular rhythm.  Pulmonary/Chest: Effort normal and breath sounds normal. No stridor. He has no wheezes. He has no rales.  Abdominal: Soft. Bowel sounds are normal. There is no tenderness. There is no rebound and no guarding.  Musculoskeletal: Normal range of motion.  Neurological: He is alert and oriented to person, place, and time.  Skin: Skin is warm and dry. Rash noted.  Generalized hives  Psychiatric: He has a normal mood and affect.     ED Treatments / Results  Labs (all labs ordered are listed, but only abnormal results are displayed) Labs Reviewed - No data to display  EKG None  Radiology No results found.  Procedures Procedures (including critical care time)  Medications Ordered in ED Medications  EPINEPHrine (EPI-PEN) injection 0.3 mg (0.3 mg Subcutaneous Given 03/31/18 0356)  diphenhydrAMINE (BENADRYL) injection 25 mg (25 mg Intravenous Given 03/31/18 0359)  methylPREDNISolone sodium succinate (SOLU-MEDROL) 125 mg/2 mL injection 125 mg (125 mg Intravenous Given 03/31/18 0400)  famotidine (PEPCID) IVPB 20 mg premix (0 mg Intravenous Stopped 03/31/18 0520)     Initial Impression / Assessment and Plan / ED Course  I have reviewed the triage vital signs and the nursing notes.  Pertinent labs & imaging results that were available during my care of the patient were reviewed by me and considered in my medical decision making (see chart for details).     Patient here with acute allergic reactions with symptoms of generalized hives and facial swelling. He feels his throat feels different but feels he is swallowing normally. He is drinking in the room without difficulty.   Patient is moderate distress on arrival. Restless, significant itching. No hypoxia. SQ epi given, IV Bendaryl, Solumedrol and Pepcid.   4:30 - facial swelling is no worse. VSS. Rash still significant.  Managing own secretions.   5:15 - hives are less prominent. Facial swelling is only slightly improved. He is drinking without difficulty.   6:30 - he is improving. Less facial swelling. Throat symptoms resolved. Rash is almost gone.   4-hour observation after Epi ends at 8:00. Patient aware.   8:00 - patient stable. No new symptoms. Will discharge home with EpiPen prn, prednisone taper, continuous benadryl and pepcid for the next 6 days.  Return precautions discussed.   Final Clinical Impressions(s) / ED Diagnoses   Final diagnoses:  None   1. Acute allergic reaction  ED Discharge Orders    None       Danne HarborUpstill, Tadarius Maland, PA-C 03/31/18 0805    Palumbo, April, MD 03/31/18 2339

## 2018-03-31 NOTE — Discharge Instructions (Addendum)
Take medications as prescribed. Use the EpiPen only if you have symptoms of throat swelling, shortness of breath, wheezing. If EpiPen use is necessary, return to the hospital immediately for further treatment.

## 2018-10-21 ENCOUNTER — Emergency Department (HOSPITAL_COMMUNITY)
Admission: EM | Admit: 2018-10-21 | Discharge: 2018-10-21 | Disposition: A | Discharge status: Home / Self Care | Payer: Self-pay | Attending: Emergency Medicine | Admitting: Emergency Medicine

## 2018-10-21 ENCOUNTER — Other Ambulatory Visit: Payer: Self-pay

## 2018-10-21 ENCOUNTER — Encounter (HOSPITAL_COMMUNITY): Payer: Self-pay | Admitting: *Deleted

## 2018-10-21 DIAGNOSIS — Z79899 Other long term (current) drug therapy: Secondary | ICD-10-CM | POA: Insufficient documentation

## 2018-10-21 DIAGNOSIS — F17210 Nicotine dependence, cigarettes, uncomplicated: Secondary | ICD-10-CM | POA: Insufficient documentation

## 2018-10-21 DIAGNOSIS — T7840XA Allergy, unspecified, initial encounter: Secondary | ICD-10-CM | POA: Insufficient documentation

## 2018-10-21 DIAGNOSIS — J45909 Unspecified asthma, uncomplicated: Secondary | ICD-10-CM | POA: Insufficient documentation

## 2018-10-21 MED ORDER — FAMOTIDINE 20 MG PO TABS
40.0000 mg | ORAL_TABLET | Freq: Once | ORAL | Status: AC
  Administered 2018-10-21: 40 mg via ORAL
  Filled 2018-10-21: qty 2

## 2018-10-21 MED ORDER — EPINEPHRINE 0.3 MG/0.3ML IJ SOAJ: 0.3000 mg | INTRAMUSCULAR | 0 refills | Status: DC | PRN

## 2018-10-21 MED ORDER — METHYLPREDNISOLONE SODIUM SUCC 125 MG IJ SOLR
125.0000 mg | Freq: Once | INTRAMUSCULAR | Status: AC
  Administered 2018-10-21: 125 mg via INTRAMUSCULAR
  Filled 2018-10-21: qty 2

## 2018-10-21 MED ORDER — PREDNISONE 20 MG PO TABS: 40.0000 mg | ORAL_TABLET | Freq: Every day | ORAL | 0 refills | Status: DC

## 2018-10-21 MED ORDER — DIPHENHYDRAMINE HCL 25 MG PO CAPS
25.0000 mg | ORAL_CAPSULE | Freq: Once | ORAL | Status: AC
  Administered 2018-10-21: 25 mg via ORAL
  Filled 2018-10-21: qty 1

## 2018-10-21 NOTE — Discharge Instructions (Addendum)
It was my pleasure taking care of you today!   Please call one of the allergy clinics listed tomorrow morning to schedule a follow up appointment.   Benadryl as needed.  Start taking Claritin or Zyrtec daily. Prednisone daily starting tomorrow morning.   Follow up with your doctor if symptoms are not improving after 3 days. You may return to the emergency department if symptoms worsen, become progressive, or become more concerning.   TREATMENT AND HOME CARE INSTRUCTIONS  If hives or rash are present:  Use an over-the-counter antihistamine (Benadryl or Zyrtec) for hives and itching as needed. Do not drive or drink alcohol until medications used to treat the reaction have worn off. Antihistamines tend to make people sleepy.  Apply cold cloths (compresses) to the skin or take baths in cool water. This will help itching. Avoid hot baths or showers. Heat will make a rash and itching worse.  See Your Primary Care Doctor if:  Your allergies are becoming progressively more troublesome.  You suspect a food allergy. Symptoms generally happen within 30 minutes of eating a food.  Your symptoms have not gone away within 2 days.  SEEK IMMEDIATE MEDICAL CARE IF:  You develop difficulty breathing or wheezing, or have a tight feeling in your chest or throat (feeling like your throat is closing) You develop swollen lips or tongue You develop hives on your chest, neck or face. You are unable to swallow fluids or salvia secretions.   A severe reaction with any of the above problems should be considered life-threatening. If you suddenly develop difficulty breathing call for local emergency medical help. THIS IS AN EMERGENCY.

## 2018-10-21 NOTE — ED Provider Notes (Signed)
[facility]-EMERGENCY DEPT Provider Note   CSN: [id number] Arrival date & time: 10/21/18  1256    History   Chief Complaint Chief Complaint  Patient presents with  . Allergic Reaction    HPI Rodney White is a 33 y.o. male.     The history is provided by the patient and medical records. No language interpreter was used.  Allergic Reaction  Presenting symptoms: rash   Presenting symptoms: no difficulty swallowing    Rodney White is a 33 y.o. male  with a PMH of asthma who presents to the Emergency Department for concerns of allergic reaction.  Patient states that he began having raised lesions diffusely to his entire body everywhere except his face at about 11 AM.  It was itchy, but not painful.  He felt as if his throat was scratchy.  He came to the emergency department where he was given a Benadryl in triage and this completely resolved his hives.  His throat feels much better, but still feels a little scratchy.  He denies any trouble breathing.  No abdominal pain, nausea or vomiting.  He reports history of similar several times over the last few months.  He believes that it occurs mostly when he is around fruit.  He has also eaten fruits or drink a fruit drink which have triggered this, however he believes that he has had episodes without fruit involvement as well.  He denies any new medications, new lotions/soaps/detergents etc.     Past Medical History:  Diagnosis Date  . Asthma     There are no active problems to display for this patient.   History reviewed. No pertinent surgical history.      Home Medications    Prior to Admission medications   Medication Sig Start Date End Date Taking? Authorizing Provider  amoxicillin-clavulanate (AUGMENTIN) 875-125 MG tablet Take 1 tablet by mouth every 12 (twelve) hours. Patient not taking: Reported on 03/31/2018 06/12/17   Lavera Guise, MD  benzonatate (TESSALON) 100 MG capsule Take 1 capsule (100 mg  total) by mouth 3 (three) times daily as needed for cough. Patient not taking: Reported on 03/31/2018 11/25/17   Michela Pitcher A, PA-C  dicyclomine (BENTYL) 20 MG tablet Take 1 tablet (20 mg total) by mouth 2 (two) times daily as needed for spasms. Patient not taking: Reported on 03/31/2018 08/31/14   Loren Racer, MD  diphenhydrAMINE (BENADRYL) 25 MG tablet Take 1 tablet (25 mg total) by mouth every 6 (six) hours. Take every 6 hours for 3 days, then as needed if symptoms return 03/31/18   Elpidio Anis, PA-C  doxycycline (VIBRAMYCIN) 100 MG capsule Take 1 capsule (100 mg total) by mouth 2 (two) times daily. Patient not taking: Reported on 03/31/2018 07/12/16   Janne Napoleon, NP  EPINEPHrine (EPIPEN 2-PAK) 0.3 mg/0.3 mL IJ SOAJ injection Inject 0.3 mLs (0.3 mg total) into the muscle as needed for anaphylaxis. 10/21/18   Ward, Chase Picket, PA-C  famotidine (PEPCID) 20 MG tablet Take 1 tablet (20 mg total) by mouth 2 (two) times daily. Take twice daily for 3 days then as needed for recurrent symptoms 03/31/18   Elpidio Anis, PA-C  HYDROcodone-acetaminophen (NORCO/VICODIN) 5-325 MG tablet Take 1-2 tablets by mouth every 6 (six) hours as needed for moderate pain or severe pain. Patient not taking: Reported on 03/31/2018 06/12/17   Lavera Guise, MD  ibuprofen (ADVIL,MOTRIN) 600 MG tablet Take 1 tablet (600 mg total) by mouth every 6 (six) hours as  needed. Patient not taking: Reported on 03/31/2018 07/12/16   Janne Napoleon, NP  ondansetron (ZOFRAN ODT) 4 MG disintegrating tablet  ODT q4 hours prn nausea/vomit Patient not taking: Reported on 03/31/2018 08/31/14   Loren Racer, MD  ondansetron (ZOFRAN) 4 MG tablet Take 1 tablet (4 mg total) by mouth every 8 (eight) hours as needed for nausea or vomiting. Patient not taking: Reported on 03/31/2018 11/25/17   Michela Pitcher A, PA-C  predniSONE (DELTASONE) 20 MG tablet Take 2 tablets (40 mg total) by mouth daily. 10/21/18   Ward, Chase Picket, PA-C    Family  History No family history on file.  Social History Social History   Tobacco Use  . Smoking status: Current Every Day Smoker    Packs/day: 0.50    Types: Cigarettes  . Smokeless tobacco: Never Used  Substance Use Topics  . Alcohol use: Yes  . Drug use: Yes    Types: Marijuana     Allergies   Patient has no known allergies.   Review of Systems Review of Systems  HENT: Positive for sore throat. Negative for trouble swallowing and voice change.   Skin: Positive for rash.  All other systems reviewed and are negative.    Physical Exam Updated Vital Signs BP 120/72 (BP Location: Left Arm)   Pulse 64   Temp 97.8 F (36.6 C) (Oral)   Resp 16   Ht  (1.676 m)   Wt 77.1 kg   SpO2 99%   BMI 27.44 kg/m   Physical Exam Vitals signs and nursing note reviewed.  Constitutional:      General: He is not in acute distress.    Appearance: He is well-developed.     Comments: Afebrile, non-toxic appearing.  HENT:     Head: Normocephalic and atraumatic.     Mouth/Throat:     Comments: Airway is patent.  No oral edema or angioedema. Eyes:     Comments: Both eyes are red.  No discharge.  Cardiovascular:     Rate and Rhythm: Normal rate and regular rhythm.     Heart sounds: Normal heart sounds.  Pulmonary:     Effort: Pulmonary effort is normal. No respiratory distress.     Breath sounds: Normal breath sounds. No wheezing or rales.     Comments: Lungs are clear to auscultation bilaterally. Musculoskeletal: Normal range of motion.        General: No tenderness.  Skin:    General: Skin is warm and dry.     Capillary Refill: Capillary refill takes less than 2 seconds.     Comments: No rash appreciated.  Neurological:     Mental Status: He is alert and oriented to person, place, and time.      ED Treatments / Results  Labs (all labs ordered are listed, but only abnormal results are displayed) Labs Reviewed - No data to display  EKG None  Radiology No results  found.  Procedures Procedures (including critical care time)  Medications Ordered in ED Medications  diphenhydrAMINE (BENADRYL) capsule 25 mg (25 mg Oral Given by Other 10/21/18 1353)  methylPREDNISolone sodium succinate (SOLU-MEDROL) 125 mg/2 mL injection 125 mg (125 mg Intramuscular Given 10/21/18 1739)  famotidine (PEPCID) tablet 40 mg (40 mg Oral Given 10/21/18 1738)     Initial Impression / Assessment and Plan / ED Course  I have reviewed the triage vital signs and the nursing notes.  Pertinent labs & imaging results that were available during my care of the  patient were reviewed by me and considered in my medical decision making (see chart for details).        Rodney White is a 33 y.o. male who presents to ED for likely allergic reaction. Reports diffuse rash at home and shows me pictures of what appears to be an urticarial rash. Reports scratchy throat as well. Was given benadryl in triage and his symptoms have drastically improved.  His rash is completely gone.  I do not see any skin involvement on exam.  There is no evidence of oral swelling or airway compromise. Lungs are clear bilaterally and vitals stable. No respiratory, GI or neurologic symptoms to suggest anaphylaxis.  He still feels a little itchy and reports having a scratchy throat although exam is very reassuring.  We will go ahead and give steroids and Pepcid and continue to monitor.  On reevaluation, symptoms have now resolved and he feels comfortable for discharge home. Evaluation does not show pathology that would require ongoing emergent intervention or inpatient treatment.  Will treat with steroid burst.  He continues to have intermittent flares of similar symptoms for the last several months.  He believes this is due to fruit exposure, but not completely certain.  He did not have a primary care doctor.  Encouraged him that he needs to follow-up with an allergist or PCP.  Resources were provided for these.  EpiPen  refilled.  Reasons to return to the emergency department were discussed and all questions answered.   Final Clinical Impressions(s) / ED Diagnoses   Final diagnoses:  Allergic reaction, initial encounter    ED Discharge Orders         Ordered    predniSONE (DELTASONE) 20 MG tablet  Daily     10/21/18 1834    EPINEPHrine (EPIPEN 2-PAK) 0.3 mg/0.3 mL IJ SOAJ injection  As needed     10/21/18 1834           Ward, Chase Picket, PA-C 10/21/18 1941    Virgina Norfolk, DO 10/21/18 2337

## 2018-10-21 NOTE — ED Notes (Signed)
Bed: WA04 Expected date:  Expected time:  Means of arrival:  Comments: EMS/n/v 

## 2018-10-21 NOTE — ED Triage Notes (Addendum)
Pt presents with pruritic urticaria on his torso and his legs bilaterally x 1 hour ago.  He also endorses throat swelling, pt is able to speak a whole sentence.  He is A&XO4, in NAD.

## 2018-12-22 ENCOUNTER — Other Ambulatory Visit: Payer: Self-pay

## 2018-12-22 ENCOUNTER — Encounter (HOSPITAL_COMMUNITY): Payer: Self-pay | Admitting: *Deleted

## 2018-12-22 ENCOUNTER — Emergency Department (HOSPITAL_COMMUNITY)
Admission: EM | Admit: 2018-12-22 | Discharge: 2018-12-22 | Disposition: A | Discharge status: Home / Self Care | Payer: Self-pay | Attending: Emergency Medicine | Admitting: Emergency Medicine

## 2018-12-22 DIAGNOSIS — F1290 Cannabis use, unspecified, uncomplicated: Secondary | ICD-10-CM | POA: Insufficient documentation

## 2018-12-22 DIAGNOSIS — F17210 Nicotine dependence, cigarettes, uncomplicated: Secondary | ICD-10-CM | POA: Insufficient documentation

## 2018-12-22 DIAGNOSIS — M25512 Pain in left shoulder: Secondary | ICD-10-CM | POA: Insufficient documentation

## 2018-12-22 DIAGNOSIS — M62838 Other muscle spasm: Secondary | ICD-10-CM | POA: Insufficient documentation

## 2018-12-22 MED ORDER — CYCLOBENZAPRINE HCL 10 MG PO TABS
5.0000 mg | ORAL_TABLET | Freq: Once | ORAL | Status: AC
  Administered 2018-12-22: 15:00:00 5 mg via ORAL
  Filled 2018-12-22: qty 1

## 2018-12-22 MED ORDER — CYCLOBENZAPRINE HCL 10 MG PO TABS: 10.0000 mg | ORAL_TABLET | Freq: Two times a day (BID) | ORAL | 0 refills | Status: DC | PRN

## 2018-12-22 MED ORDER — OXYCODONE-ACETAMINOPHEN 5-325 MG PO TABS
1.0000 | ORAL_TABLET | Freq: Once | ORAL | Status: AC
  Administered 2018-12-22: 1 via ORAL
  Filled 2018-12-22: qty 1

## 2018-12-22 MED ORDER — KETOROLAC TROMETHAMINE 30 MG/ML IJ SOLN
30.0000 mg | Freq: Once | INTRAMUSCULAR | Status: AC
  Administered 2018-12-22: 30 mg via INTRAMUSCULAR
  Filled 2018-12-22: qty 1

## 2018-12-22 NOTE — ED Provider Notes (Signed)
[facility]   CSN: [id number] Arrival date & time: 12/22/18  1414    History   Chief Complaint No chief complaint on file.   HPI Rodney MillersBrandon White is a 33 y.o. male.     HPI   33 year old male presents today with complaints of left neck and shoulder pain.  Patient notes he woke up approximately 4 days ago with the pain in his left trapezius and lateral neck.  He notes that sharp in nature.  Worse when he moves his neck or pushes on the area.  He notes limited range of motion of the shoulder secondary to pain at the trapezius.  He denies any pain at the shoulder elbow hand or wrist.  He denies any loss of distal sensation strength motor function.  He notes occasional tingling in his fingers, no neurological deficits presently.  Denies any trauma, IV drug use, fever.  No medications prior to arrival.  Past Medical History:  Diagnosis Date  . Asthma     There are no active problems to display for this patient.   History reviewed. No pertinent surgical history.      Home Medications    Prior to Admission medications   Medication Sig Start Date End Date Taking? Authorizing Provider  amoxicillin-clavulanate (AUGMENTIN) 875-125 MG tablet Take 1 tablet by mouth every 12 (twelve) hours. Patient not taking: Reported on 03/31/2018 06/12/17   Lavera GuiseLiu, Dana Duo, MD  benzonatate (TESSALON) 100 MG capsule Take 1 capsule (100 mg total) by mouth 3 (three) times daily as needed for cough. Patient not taking: Reported on 03/31/2018 11/25/17   Michela PitcherFawze, Mina A, PA-C  cyclobenzaprine (FLEXERIL) 10 MG tablet Take 1 tablet (10 mg total) by mouth 2 (two) times daily as needed for muscle spasms. 12/22/18   Emryn Flanery, Tinnie GensJeffrey, PA-C  dicyclomine (BENTYL) 20 MG tablet Take 1 tablet (20 mg total) by mouth 2 (two) times daily as needed for spasms. Patient not taking: Reported on 03/31/2018 08/31/14   Loren RacerYelverton, David, MD  diphenhydrAMINE (BENADRYL) 25 MG tablet Take 1  tablet (25 mg total) by mouth every 6 (six) hours. Take every 6 hours for 3 days, then as needed if symptoms return 03/31/18   Elpidio AnisUpstill, Shari, PA-C  doxycycline (VIBRAMYCIN) 100 MG capsule Take 1 capsule (100 mg total) by mouth 2 (two) times daily. Patient not taking: Reported on 03/31/2018 07/12/16   Janne NapoleonNeese, Hope M, NP  EPINEPHrine (EPIPEN 2-PAK) 0.3 mg/0.3 mL IJ SOAJ injection Inject 0.3 mLs (0.3 mg total) into the muscle as needed for anaphylaxis. 10/21/18   Ward, Chase PicketJaime Pilcher, PA-C  famotidine (PEPCID) 20 MG tablet Take 1 tablet (20 mg total) by mouth 2 (two) times daily. Take twice daily for 3 days then as needed for recurrent symptoms 03/31/18   Elpidio AnisUpstill, Shari, PA-C  HYDROcodone-acetaminophen (NORCO/VICODIN) 5-325 MG tablet Take 1-2 tablets by mouth every 6 (six) hours as needed for moderate pain or severe pain. Patient not taking: Reported on 03/31/2018 06/12/17   Lavera GuiseLiu, Dana Duo, MD  ibuprofen (ADVIL,MOTRIN) 600 MG tablet Take 1 tablet (600 mg total) by mouth every 6 (six) hours as needed. Patient not taking: Reported on 03/31/2018 07/12/16   Janne NapoleonNeese, Hope M, NP  ondansetron (ZOFRAN ODT) 4 MG disintegrating tablet 4mg  ODT q4 hours prn nausea/vomit Patient not taking: Reported on 03/31/2018 08/31/14   Loren RacerYelverton, David, MD  ondansetron (ZOFRAN) 4 MG tablet Take 1 tablet (4 mg total) by mouth every 8 (eight) hours as needed for nausea  or vomiting. Patient not taking: Reported on 03/31/2018 11/25/17   Michela Pitcher A, PA-C  predniSONE (DELTASONE) 20 MG tablet Take 2 tablets (40 mg total) by mouth daily. 10/21/18   Ward, Chase Picket, PA-C    Family History No family history on file.  Social History Social History   Tobacco Use  . Smoking status: Current Every Day Smoker    Packs/day: 0.50    Types: Cigarettes  . Smokeless tobacco: Never Used  Substance Use Topics  . Alcohol use: Yes  . Drug use: Yes    Types: Marijuana     Allergies   Patient has no known allergies.   Review of Systems Review  of Systems  All other systems reviewed and are negative.    Physical Exam Updated Vital Signs BP 135/75 (BP Location: Right Arm)   Pulse 99   Temp 97.9 F (36.6 C) (Oral)   Resp 16   Ht  (1.651 m)   Wt 74.8 kg   SpO2 100%   BMI 27.46 kg/m   Physical Exam Vitals signs and nursing note reviewed.  Constitutional:      Appearance: He is well-developed.  HENT:     Head: Normocephalic and atraumatic.  Eyes:     General: No scleral icterus.       Right eye: No discharge.        Left eye: No discharge.     Conjunctiva/sclera: Conjunctivae normal.     Pupils: Pupils are equal, round, and reactive to light.  Neck:     Musculoskeletal: Normal range of motion.     Vascular: No JVD.     Trachea: No tracheal deviation.  Pulmonary:     Effort: Pulmonary effort is normal.     Breath sounds: No stridor.  Musculoskeletal:     Comments: Tenderness palpation her right upper trapezius and lateral cervical musculature.  No pain with axial compression, reduced range of motion at the shoulder secondary to pain at the trapezius, elbow flexion extension intact grip strength 5 out of 5 distal sensation intact radial pulse 2+.-Redness swelling or warmth to touch to the neck or back  Neurological:     Mental Status: He is alert and oriented to person, place, and time.     Coordination: Coordination normal.  Psychiatric:        Behavior: Behavior normal.        Thought Content: Thought content normal.        Judgment: Judgment normal.      ED Treatments / Results  Labs (all labs ordered are listed, but only abnormal results are displayed) Labs Reviewed - No data to display  EKG None  Radiology No results found.  Procedures Procedures (including critical care time)  Medications Ordered in ED Medications  oxyCODONE-acetaminophen (PERCOCET/ROXICET) 5-325 MG per tablet 1 tablet (has no administration in time range)  ketorolac (TORADOL) 30 MG/ML injection 30 mg (30 mg  Intramuscular Given 12/22/18 1500)  cyclobenzaprine (FLEXERIL) tablet 5 mg (5 mg Oral Given 12/22/18 1500)     Initial Impression / Assessment and Plan / ED Course  I have reviewed the triage vital signs and the nursing notes.  Pertinent labs & imaging results that were available during my care of the patient were reviewed by me and considered in my medical decision making (see chart for details).        33 year old male presents today with likely muscular pain.  I do have question of radicular pain from the cervical  region.  Patient has no acute neurological deficits.  He was given Toradol and Flexeril here which improved his symptoms.  He will be discharged with symptomatic care and strict return precautions.  He verbalized understanding and agreement to today's plan had no further questions or concerns.  Final Clinical Impressions(s) / ED Diagnoses   Final diagnoses:  Trapezius muscle spasm    ED Discharge Orders         Ordered    cyclobenzaprine (FLEXERIL) 10 MG tablet  2 times daily PRN     12/22/18 1521           Eyvonne Mechanic, PA-C 12/22/18 1545    Gwyneth Sprout, MD 12/22/18 1554

## 2018-12-22 NOTE — Discharge Instructions (Signed)
Please read attached information. If you experience any new or worsening signs or symptoms please return to the emergency room for evaluation. Please follow-up with your primary care provider or specialist as discussed. Please use medication prescribed only as directed and discontinue taking if you have any concerning signs or symptoms.   °

## 2018-12-22 NOTE — ED Triage Notes (Signed)
Pt in c/o L shoulder pain onset x 3 days, denies injury, pt able to wiggle fingers, skin intact, no obvious deformity, A&O x4

## 2018-12-22 NOTE — ED Notes (Signed)
Discharge instructions and prescriptions discussed with Pt. Pt verbalized understanding. Pt stable and ambulatory.   

## 2019-04-02 ENCOUNTER — Emergency Department (HOSPITAL_COMMUNITY)
Admission: EM | Admit: 2019-04-02 | Discharge: 2019-04-02 | Disposition: A | Discharge status: Home / Self Care | Payer: Self-pay | Attending: Emergency Medicine | Admitting: Emergency Medicine

## 2019-04-02 ENCOUNTER — Other Ambulatory Visit: Payer: Self-pay

## 2019-04-02 ENCOUNTER — Encounter (HOSPITAL_COMMUNITY): Payer: Self-pay

## 2019-04-02 DIAGNOSIS — R109 Unspecified abdominal pain: Secondary | ICD-10-CM | POA: Insufficient documentation

## 2019-04-02 DIAGNOSIS — Z5321 Procedure and treatment not carried out due to patient leaving prior to being seen by health care provider: Secondary | ICD-10-CM | POA: Insufficient documentation

## 2019-04-02 DIAGNOSIS — R112 Nausea with vomiting, unspecified: Secondary | ICD-10-CM | POA: Insufficient documentation

## 2019-04-02 DIAGNOSIS — R197 Diarrhea, unspecified: Secondary | ICD-10-CM | POA: Insufficient documentation

## 2019-04-02 MED ORDER — SODIUM CHLORIDE 0.9% FLUSH: 3.0000 mL | Freq: Once | INTRAVENOUS | Status: DC

## 2019-04-02 NOTE — ED Notes (Signed)
Pt gave stickers to registration and stated that he was leaving.

## 2019-04-02 NOTE — ED Triage Notes (Signed)
Pt reports abdominal pain, N/V/D since this morning. States it feels like cramping or tightening. Denies urinary symptoms. Denies sick contacts. A&Ox4. Ambulatory.

## 2019-04-03 ENCOUNTER — Encounter (HOSPITAL_COMMUNITY): Payer: Self-pay | Admitting: *Deleted

## 2019-04-03 ENCOUNTER — Emergency Department (HOSPITAL_COMMUNITY): Payer: Self-pay

## 2019-04-03 ENCOUNTER — Other Ambulatory Visit: Payer: Self-pay

## 2019-04-03 ENCOUNTER — Emergency Department (HOSPITAL_COMMUNITY)
Admission: EM | Admit: 2019-04-03 | Discharge: 2019-04-03 | Disposition: A | Discharge status: Home / Self Care | Payer: Self-pay | Attending: Emergency Medicine | Admitting: Emergency Medicine

## 2019-04-03 DIAGNOSIS — R109 Unspecified abdominal pain: Secondary | ICD-10-CM

## 2019-04-03 DIAGNOSIS — R197 Diarrhea, unspecified: Secondary | ICD-10-CM | POA: Insufficient documentation

## 2019-04-03 DIAGNOSIS — F17210 Nicotine dependence, cigarettes, uncomplicated: Secondary | ICD-10-CM | POA: Insufficient documentation

## 2019-04-03 DIAGNOSIS — Z79899 Other long term (current) drug therapy: Secondary | ICD-10-CM | POA: Insufficient documentation

## 2019-04-03 DIAGNOSIS — R112 Nausea with vomiting, unspecified: Secondary | ICD-10-CM | POA: Insufficient documentation

## 2019-04-03 DIAGNOSIS — R1030 Lower abdominal pain, unspecified: Secondary | ICD-10-CM | POA: Insufficient documentation

## 2019-04-03 DIAGNOSIS — J45909 Unspecified asthma, uncomplicated: Secondary | ICD-10-CM | POA: Insufficient documentation

## 2019-04-03 LAB — COMPREHENSIVE METABOLIC PANEL
ALT: 19 U/L (ref 0–44)
AST: 20 U/L (ref 15–41)
Albumin: 3.8 g/dL (ref 3.5–5.0)
Alkaline Phosphatase: 48 U/L (ref 38–126)
Anion gap: 9 (ref 5–15)
BUN: 8 mg/dL (ref 6–20)
CO2: 23 mmol/L (ref 22–32)
Calcium: 9.2 mg/dL (ref 8.9–10.3)
Chloride: 107 mmol/L (ref 98–111)
Creatinine, Ser: 0.88 mg/dL (ref 0.61–1.24)
GFR calc Af Amer: >60 mL/min (ref >60)
GFR calc non Af Amer: >60 mL/min (ref >60)
Glucose, Bld: 129 mg/dL — ABNORMAL HIGH (ref 70–99)
Potassium: 3.9 mmol/L (ref 3.5–5.1)
Sodium: 139 mmol/L (ref 135–145)
Total Bilirubin: 0.9 mg/dL (ref 0.3–1.2)
Total Protein: 6.8 g/dL (ref 6.5–8.1)

## 2019-04-03 LAB — URINALYSIS, ROUTINE W REFLEX MICROSCOPIC
Bilirubin Urine: NEGATIVE
Glucose, UA: NEGATIVE mg/dL
Hgb urine dipstick: NEGATIVE
Ketones, ur: NEGATIVE mg/dL
Leukocytes,Ua: NEGATIVE
Nitrite: NEGATIVE
Protein, ur: NEGATIVE mg/dL
Specific Gravity, Urine: 1.006 (ref 1.005–1.030)
pH: 6 (ref 5.0–8.0)

## 2019-04-03 LAB — CBC
HCT: 46.3 % (ref 39.0–52.0)
Hemoglobin: 15.6 g/dL (ref 13.0–17.0)
MCH: 30.2 pg (ref 26.0–34.0)
MCHC: 33.7 g/dL (ref 30.0–36.0)
MCV: 89.7 fL (ref 80.0–100.0)
Platelets: 276 10*3/uL (ref 150–400)
RBC: 5.16 MIL/uL (ref 4.22–5.81)
RDW: 13.7 % (ref 11.5–15.5)
WBC: 6.5 10*3/uL (ref 4.0–10.5)
nRBC: 0 % (ref 0.0–0.2)

## 2019-04-03 LAB — LIPASE, BLOOD: Lipase: 24 U/L (ref 11–51)

## 2019-04-03 MED ORDER — SODIUM CHLORIDE 0.9 % IV BOLUS
1000.0000 mL | Freq: Once | INTRAVENOUS | Status: AC
  Administered 2019-04-03: 1000 mL via INTRAVENOUS

## 2019-04-03 MED ORDER — ONDANSETRON 4 MG PO TBDP: 4.0000 mg | ORAL_TABLET | Freq: Three times a day (TID) | ORAL | 0 refills | Status: DC | PRN

## 2019-04-03 MED ORDER — ONDANSETRON HCL 4 MG/2ML IJ SOLN
4.0000 mg | Freq: Once | INTRAMUSCULAR | Status: AC
  Administered 2019-04-03: 4 mg via INTRAVENOUS
  Filled 2019-04-03: qty 2

## 2019-04-03 MED ORDER — IOHEXOL 300 MG/ML  SOLN
100.0000 mL | Freq: Once | INTRAMUSCULAR | Status: AC | PRN
  Administered 2019-04-03: 100 mL via INTRAVENOUS

## 2019-04-03 MED ORDER — MORPHINE SULFATE (PF) 4 MG/ML IV SOLN
4.0000 mg | Freq: Once | INTRAVENOUS | Status: AC
  Administered 2019-04-03: 4 mg via INTRAVENOUS
  Filled 2019-04-03: qty 1

## 2019-04-03 MED ORDER — SODIUM CHLORIDE 0.9% FLUSH
3.0000 mL | Freq: Once | INTRAVENOUS | Status: AC
  Administered 2019-04-03: 3 mL via INTRAVENOUS

## 2019-04-03 NOTE — ED Notes (Signed)
Patient verbalizes understanding of discharge instructions. Opportunity for questioning and answers were provided. Armband removed by staff, pt discharged from ED.  

## 2019-04-03 NOTE — ED Provider Notes (Signed)
[facility]   CSN: [id number] Arrival date & time: 04/03/19  1727    History   Chief Complaint Chief Complaint  Patient presents with  . Abdominal Pain    HPI Rodney White is a 33 y.o. male has medical history of asthma presents emergency department today with chief complaint of abdominal pain x2 days.  Patient states pain is located in bilateral lower quadrants.  He states the pain has been constant.  He describes the pain as aching, he rates it 8 out of 10 in severity.  He is also reporting associated nausea, emesis, and diarrhea.  He has had 2 episodes of nonbloody nonbilious emesis last 24 hours.  He estimates 4-5 episodes of nonbloody loose stool. He thinks he might have had fever at home but did not check his temperature.  He also admits to anorexia.  He has not taken any medications for symptoms prior to arrival.  He denies any sick contacts or suspicious food intake.  Also denies cough, chest pain, urinary frequency, hematuria, back pain.  Denies abdominal surgical history.    Past Medical History:  Diagnosis Date  . Asthma     There are no active problems to display for this patient.   History reviewed. No pertinent surgical history.      Home Medications    Prior to Admission medications   Medication Sig Start Date End Date Taking? Authorizing Provider  amoxicillin-clavulanate (AUGMENTIN) 875-125 MG tablet Take 1 tablet by mouth every 12 (twelve) hours. Patient not taking: Reported on 03/31/2018 06/12/17   Lavera GuiseLiu, Dana Duo, MD  benzonatate (TESSALON) 100 MG capsule Take 1 capsule (100 mg total) by mouth 3 (three) times daily as needed for cough. Patient not taking: Reported on 03/31/2018 11/25/17   Michela PitcherFawze, Mina A, PA-C  cyclobenzaprine (FLEXERIL) 10 MG tablet Take 1 tablet (10 mg total) by mouth 2 (two) times daily as needed for muscle spasms. 12/22/18   Hedges, Tinnie GensJeffrey, PA-C  dicyclomine (BENTYL) 20 MG tablet Take 1  tablet (20 mg total) by mouth 2 (two) times daily as needed for spasms. Patient not taking: Reported on 03/31/2018 08/31/14   Loren RacerYelverton, David, MD  diphenhydrAMINE (BENADRYL) 25 MG tablet Take 1 tablet (25 mg total) by mouth every 6 (six) hours. Take every 6 hours for 3 days, then as needed if symptoms return 03/31/18   Elpidio AnisUpstill, Shari, PA-C  doxycycline (VIBRAMYCIN) 100 MG capsule Take 1 capsule (100 mg total) by mouth 2 (two) times daily. Patient not taking: Reported on 03/31/2018 07/12/16   Janne NapoleonNeese, Hope M, NP  EPINEPHrine (EPIPEN 2-PAK) 0.3 mg/0.3 mL IJ SOAJ injection Inject 0.3 mLs (0.3 mg total) into the muscle as needed for anaphylaxis. 10/21/18   Ward, Chase PicketJaime Pilcher, PA-C  famotidine (PEPCID) 20 MG tablet Take 1 tablet (20 mg total) by mouth 2 (two) times daily. Take twice daily for 3 days then as needed for recurrent symptoms 03/31/18   Elpidio AnisUpstill, Shari, PA-C  HYDROcodone-acetaminophen (NORCO/VICODIN) 5-325 MG tablet Take 1-2 tablets by mouth every 6 (six) hours as needed for moderate pain or severe pain. Patient not taking: Reported on 03/31/2018 06/12/17   Lavera GuiseLiu, Dana Duo, MD  ibuprofen (ADVIL,MOTRIN) 600 MG tablet Take 1 tablet (600 mg total) by mouth every 6 (six) hours as needed. Patient not taking: Reported on 03/31/2018 07/12/16   Janne NapoleonNeese, Hope M, NP  ondansetron (ZOFRAN ODT) 4 MG disintegrating tablet Take 1 tablet (4 mg total) by mouth every 8 (eight) hours as  needed for nausea or vomiting. 04/03/19   Albrizze, Yvonna AlanisKaitlyn E, PA-C  ondansetron (ZOFRAN) 4 MG tablet Take 1 tablet (4 mg total) by mouth every 8 (eight) hours as needed for nausea or vomiting. Patient not taking: Reported on 03/31/2018 11/25/17   Michela PitcherFawze, Mina A, PA-C  predniSONE (DELTASONE) 20 MG tablet Take 2 tablets (40 mg total) by mouth daily. 10/21/18   Ward, Chase PicketJaime Pilcher, PA-C    Family History No family history on file.  Social History Social History   Tobacco Use  . Smoking status: Current Every Day Smoker    Packs/day: 0.50     Types: Cigarettes  . Smokeless tobacco: Never Used  Substance Use Topics  . Alcohol use: Yes  . Drug use: Yes    Types: Marijuana     Allergies   Patient has no known allergies.   Review of Systems Review of Systems  Constitutional: Positive for appetite change and fever. Negative for chills.  HENT: Negative for congestion, rhinorrhea, sinus pressure and sore throat.   Eyes: Negative for pain and redness.  Respiratory: Negative for cough, shortness of breath and wheezing.   Cardiovascular: Negative for chest pain and palpitations.  Gastrointestinal: Positive for abdominal pain, diarrhea, nausea and vomiting. Negative for constipation.  Genitourinary: Negative for dysuria.  Musculoskeletal: Negative for arthralgias, back pain, myalgias and neck pain.  Skin: Negative for rash and wound.  Neurological: Negative for dizziness, syncope, weakness, numbness and headaches.  Psychiatric/Behavioral: Negative for confusion.     Physical Exam Updated Vital Signs BP 126/72 (BP Location: Right Arm)   Pulse 95   Temp 98.5 F (36.9 C) (Oral)   Resp 18   Ht 5\' 6"  (1.676 m)   Wt 77.1 kg   SpO2 100%   BMI 27.44 kg/m   Physical Exam Vitals signs and nursing note reviewed.  Constitutional:      General: He is not in acute distress.    Appearance: He is not ill-appearing.  HENT:     Head: Normocephalic and atraumatic.     Right Ear: Tympanic membrane and external ear normal.     Left Ear: Tympanic membrane and external ear normal.     Nose: Nose normal.     Mouth/Throat:     Mouth: Mucous membranes are dry.     Pharynx: Oropharynx is clear.  Eyes:     General: No scleral icterus.       Right eye: No discharge.        Left eye: No discharge.     Extraocular Movements: Extraocular movements intact.     Conjunctiva/sclera: Conjunctivae normal.     Pupils: Pupils are equal, round, and reactive to light.  Neck:     Musculoskeletal: Normal range of motion.     Vascular: No JVD.   Cardiovascular:     Rate and Rhythm: Normal rate and regular rhythm.     Pulses: Normal pulses.          Radial pulses are 2+ on the right side and 2+ on the left side.     Heart sounds: Normal heart sounds.  Pulmonary:     Comments: Lungs clear to auscultation in all fields. Symmetric chest rise. No wheezing, rales, or rhonchi. Abdominal:     Tenderness: There is no right CVA tenderness or left CVA tenderness.     Comments: Abdomen is soft, non-distended.  Tenderness to palpation of right and left lower quadrants with guarding no rigidity. No peritoneal signs.  Musculoskeletal: Normal  range of motion.  Skin:    General: Skin is warm and dry.     Capillary Refill: Capillary refill takes less than 2 seconds.  Neurological:     Mental Status: He is oriented to person, place, and time.     GCS: GCS eye subscore is 4. GCS verbal subscore is 5. GCS motor subscore is 6.     Comments: Fluent speech, no facial droop.  Psychiatric:        Behavior: Behavior normal.      ED Treatments / Results  Labs (all labs ordered are listed, but only abnormal results are displayed) Labs Reviewed  COMPREHENSIVE METABOLIC PANEL - Abnormal; Notable for the following components:      Result Value   Glucose, Bld 129 (*)    All other components within normal limits  LIPASE, BLOOD  CBC  URINALYSIS, ROUTINE W REFLEX MICROSCOPIC    EKG None  Radiology Ct Abdomen Pelvis W Contrast  Result Date: 04/03/2019 CLINICAL DATA:  Acute abdominal pain, generalized LEFT lower quadrant and RIGHT lower quadrant pain, nausea and vomiting. EXAM: CT ABDOMEN AND PELVIS WITH CONTRAST TECHNIQUE: Multidetector CT imaging of the abdomen and pelvis was performed using the standard protocol following bolus administration of intravenous contrast. CONTRAST:  167mL OMNIPAQUE IOHEXOL 300 MG/ML  SOLN COMPARISON:  None. FINDINGS: Lower chest: No acute abnormality. Hepatobiliary: No focal liver abnormality is seen. No gallstones,  gallbladder wall thickening, or biliary dilatation. Pancreas: Unremarkable. No pancreatic ductal dilatation or surrounding inflammatory changes. Spleen: Normal in size without focal abnormality. Adrenals/Urinary Tract: Small LEFT renal cyst. Kidneys otherwise unremarkable without suspicious mass, stone or hydronephrosis. No perinephric fluid. No ureteral or bladder calculi identified. Bladder appears normal, partially decompressed. Stomach/Bowel: No dilated large or small bowel loops. Questionable edematous thickening of the walls of the RIGHT colon/cecum, suspicious for colitis of infectious or inflammatory nature. Appendix is normal. No thickening of the walls of the small bowel or mesenteric inflammation, but fluid is present within the small bowel which can be an indication of underlying enteritis. Vascular/Lymphatic: Mild atherosclerosis at the aortic bifurcation. No acute appearing vascular abnormality. No enlarged lymph nodes seen. Reproductive: Prostate is unremarkable. Other: No free fluid or abscess collection. No free intraperitoneal air. Musculoskeletal: No acute or suspicious osseous finding. IMPRESSION: 1. Questionable edematous thickening of the walls of the RIGHT colon/cecum, suspicious for a mild colitis of infectious or inflammatory nature. No bowel obstruction. 2. Also, fluid within the small bowel which can be an indication of underlying enteritis or enterocolitis. 3. Appendix is normal. No renal or ureteral calculi. No free fluid. No free intraperitoneal air. 4. Aortic atherosclerosis. Aortic Atherosclerosis (ICD10-I70.0). Electronically Signed   By: Franki Cabot M.D.   On: 04/03/2019 20:01    Procedures Procedures (including critical care time)  Medications Ordered in ED Medications  sodium chloride flush (NS) 0.9 % injection 3 mL (3 mLs Intravenous Given 04/03/19 1857)  sodium chloride 0.9 % bolus 1,000 mL (0 mLs Intravenous Stopped 04/03/19 2048)  ondansetron (ZOFRAN) injection 4 mg  (4 mg Intravenous Given 04/03/19 1903)  morphine 4 MG/ML injection 4 mg (4 mg Intravenous Given 04/03/19 1903)  iohexol (OMNIPAQUE) 300 MG/ML solution 100 mL (100 mLs Intravenous Contrast Given 04/03/19 1944)     Initial Impression / Assessment and Plan / ED Course  I have reviewed the triage vital signs and the nursing notes.  Pertinent labs & imaging results that were available during my care of the patient were reviewed by me and  considered in my medical decision making (see chart for details).  Patient presents to the ED with complaints of abdominal pain. Patient nontoxic appearing, in no apparent distress, vitals WNL. On exam patient tender to right and left lower quadrants, no peritoneal signs. Will evaluate with labs and CT A/P. Analgesics, anti-emetics, and fluids administered.  Labs reviewed and grossly unremarkable. No leukocytosis, no anemia, no significant electrolyte derangements. LFTs, renal function, and lipase WNL. Urinalysis without obvious infection.  Imaging shows colitis, viral vs bacterial. Given pt has no leukocytosis, no fever, likeley viral etiology. On repeat abdominal exam patient remains without peritoneal signs. Pain has improved.  Patient tolerating PO in the emergency department. Will discharge home with supportive measures. I discussed results, treatment plan, need for PCP follow-up, and strict return precautions including fever, worsening pain , nausea and vomiting with the patient. Provided opportunity for questions, patient confirmed understanding and is in agreement with plan. Findings and plan of care discussed with supervising physician Dr. Jodi MourningZavitz.  This note was prepared using Dragon voice recognition software and may include unintentional dictation errors due to the inherent limitations of voice recognition software.    Final Clinical Impressions(s) / ED Diagnoses   Final diagnoses:  Abdominal pain, unspecified abdominal location    ED Discharge Orders          Ordered    ondansetron (ZOFRAN ODT) 4 MG disintegrating tablet  Every 8 hours PRN     04/03/19 2059           Kathyrn Lasslbrizze, Kaitlyn E, PA-C 04/03/19 2147    Lorre NickAllen, Anthony, MD 04/04/19 1119

## 2019-04-03 NOTE — ED Notes (Signed)
Pt able to ingest PO fluids without difficulty or complaint. No N/V/D. No abd pain.
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
## 2019-04-03 NOTE — Discharge Instructions (Addendum)
There are many causes of abdominal pain. Most pain is not serious and goes away, but some pain gets worse, changes, or will not go away. Please return to the emergency department or see your doctor right away if you (or your family member) experience any of the following:   Pain that gets worse or moves to just one spot.  Pain that gets worse if you cough or sneeze.  Pain with going over a bump in the road.  Pain that does not get better in 24 hours.  Inability to keep down liquids (vomiting)--especially if you are making less urine.  Fainting.  Blood in the vomit or stool.  High fever or shaking chills.  Swelling of the abdomen.  Any new or worsening problem.   Follow-up Instructions  -Follow-up with your primary care provider for further evaluation -Return to the emergency department for any new or worsening symptoms  Medications  Take the following medications:  -Zofran sent to your pharmacy for nausea.  Please take this as needed.   -You can take Tylenol and ibuprofen for pain as needed.  Please take as directed on the box.   Additional Instructions  No alcohol.  No caffeine, aspirin, or cigarettes.

## 2019-07-25 ENCOUNTER — Emergency Department (HOSPITAL_COMMUNITY): Payer: Self-pay

## 2019-07-25 ENCOUNTER — Emergency Department (HOSPITAL_COMMUNITY)
Admission: EM | Admit: 2019-07-25 | Discharge: 2019-07-25 | Disposition: A | Discharge status: Home / Self Care | Payer: Self-pay | Attending: Emergency Medicine | Admitting: Emergency Medicine

## 2019-07-25 ENCOUNTER — Encounter (HOSPITAL_COMMUNITY): Payer: Self-pay | Admitting: Emergency Medicine

## 2019-07-25 DIAGNOSIS — T148XXA Other injury of unspecified body region, initial encounter: Secondary | ICD-10-CM

## 2019-07-25 DIAGNOSIS — Y929 Unspecified place or not applicable: Secondary | ICD-10-CM | POA: Insufficient documentation

## 2019-07-25 DIAGNOSIS — J45909 Unspecified asthma, uncomplicated: Secondary | ICD-10-CM | POA: Insufficient documentation

## 2019-07-25 DIAGNOSIS — F17210 Nicotine dependence, cigarettes, uncomplicated: Secondary | ICD-10-CM | POA: Insufficient documentation

## 2019-07-25 DIAGNOSIS — S1183XA Puncture wound without foreign body of other specified part of neck, initial encounter: Secondary | ICD-10-CM | POA: Insufficient documentation

## 2019-07-25 DIAGNOSIS — Y939 Activity, unspecified: Secondary | ICD-10-CM | POA: Insufficient documentation

## 2019-07-25 DIAGNOSIS — W260XXA Contact with knife, initial encounter: Secondary | ICD-10-CM | POA: Insufficient documentation

## 2019-07-25 DIAGNOSIS — Z23 Encounter for immunization: Secondary | ICD-10-CM | POA: Insufficient documentation

## 2019-07-25 DIAGNOSIS — Y999 Unspecified external cause status: Secondary | ICD-10-CM | POA: Insufficient documentation

## 2019-07-25 LAB — COMPREHENSIVE METABOLIC PANEL
ALT: 18 U/L (ref 0–44)
AST: 20 U/L (ref 15–41)
Albumin: 3.8 g/dL (ref 3.5–5.0)
Alkaline Phosphatase: 60 U/L (ref 38–126)
Anion gap: 18 — ABNORMAL HIGH (ref 5–15)
BUN: 9 mg/dL (ref 6–20)
CO2: 15 mmol/L — ABNORMAL LOW (ref 22–32)
Calcium: 8.9 mg/dL (ref 8.9–10.3)
Chloride: 106 mmol/L (ref 98–111)
Creatinine, Ser: 0.94 mg/dL (ref 0.61–1.24)
GFR calc Af Amer: >60 mL/min (ref >60)
GFR calc non Af Amer: >60 mL/min (ref >60)
Glucose, Bld: 110 mg/dL — ABNORMAL HIGH (ref 70–99)
Potassium: 3.7 mmol/L (ref 3.5–5.1)
Sodium: 139 mmol/L (ref 135–145)
Total Bilirubin: 0.4 mg/dL (ref 0.3–1.2)
Total Protein: 6.7 g/dL (ref 6.5–8.1)

## 2019-07-25 LAB — I-STAT CHEM 8, ED
BUN: 9 mg/dL (ref 6–20)
Calcium, Ion: 0.97 mmol/L — ABNORMAL LOW (ref 1.15–1.40)
Chloride: 107 mmol/L (ref 98–111)
Creatinine, Ser: 1 mg/dL (ref 0.61–1.24)
Glucose, Bld: 108 mg/dL — ABNORMAL HIGH (ref 70–99)
HCT: 48 % (ref 39.0–52.0)
Hemoglobin: 16.3 g/dL (ref 13.0–17.0)
Potassium: 3.6 mmol/L (ref 3.5–5.1)
Sodium: 139 mmol/L (ref 135–145)
TCO2: 17 mmol/L — ABNORMAL LOW (ref 22–32)

## 2019-07-25 LAB — PROTIME-INR
INR: 1 (ref 0.8–1.2)
Prothrombin Time: 12.6 seconds (ref 11.4–15.2)

## 2019-07-25 LAB — CBC WITH DIFFERENTIAL/PLATELET
Abs Immature Granulocytes: 0.02 10*3/uL (ref 0.00–0.07)
Basophils Absolute: 0 10*3/uL (ref 0.0–0.1)
Basophils Relative: 1 %
Eosinophils Absolute: 0.1 10*3/uL (ref 0.0–0.5)
Eosinophils Relative: 1 %
HCT: 46.8 % (ref 39.0–52.0)
Hemoglobin: 15.8 g/dL (ref 13.0–17.0)
Immature Granulocytes: 0 %
Lymphocytes Relative: 23 %
Lymphs Abs: 1.3 10*3/uL (ref 0.7–4.0)
MCH: 30.2 pg (ref 26.0–34.0)
MCHC: 33.8 g/dL (ref 30.0–36.0)
MCV: 89.5 fL (ref 80.0–100.0)
Monocytes Absolute: 0.5 10*3/uL (ref 0.1–1.0)
Monocytes Relative: 8 %
Neutro Abs: 4 10*3/uL (ref 1.7–7.7)
Neutrophils Relative %: 67 %
Platelets: 289 10*3/uL (ref 150–400)
RBC: 5.23 MIL/uL (ref 4.22–5.81)
RDW: 13.4 % (ref 11.5–15.5)
WBC: 5.9 10*3/uL (ref 4.0–10.5)
nRBC: 0 % (ref 0.0–0.2)

## 2019-07-25 LAB — URINALYSIS, ROUTINE W REFLEX MICROSCOPIC
Bacteria, UA: NONE SEEN
Bilirubin Urine: NEGATIVE
Glucose, UA: NEGATIVE mg/dL
Ketones, ur: 5 mg/dL — AB
Leukocytes,Ua: NEGATIVE
Nitrite: NEGATIVE
Protein, ur: 30 mg/dL — AB
Specific Gravity, Urine: 1.03 (ref 1.005–1.030)
pH: 5 (ref 5.0–8.0)

## 2019-07-25 LAB — TYPE AND SCREEN
ABO/RH(D): A POS
Antibody Screen: NEGATIVE

## 2019-07-25 LAB — LACTIC ACID, PLASMA: Lactic Acid, Venous: 7.8 mmol/L (ref 0.5–1.9)

## 2019-07-25 LAB — CDS SEROLOGY

## 2019-07-25 LAB — ETHANOL: Alcohol, Ethyl (B): 122 mg/dL — ABNORMAL HIGH (ref <10)

## 2019-07-25 LAB — ABO/RH: ABO/RH(D): A POS

## 2019-07-25 MED ORDER — FENTANYL CITRATE (PF) 100 MCG/2ML IJ SOLN
100.0000 ug | Freq: Once | INTRAMUSCULAR | Status: AC
  Administered 2019-07-25: 100 ug via INTRAVENOUS
  Filled 2019-07-25: qty 2

## 2019-07-25 MED ORDER — SODIUM CHLORIDE 0.9 % IV BOLUS
1000.0000 mL | Freq: Once | INTRAVENOUS | Status: AC
  Administered 2019-07-25: 1000 mL via INTRAVENOUS

## 2019-07-25 MED ORDER — TETANUS-DIPHTH-ACELL PERTUSSIS 5-2.5-18.5 LF-MCG/0.5 IM SUSP
0.5000 mL | Freq: Once | INTRAMUSCULAR | Status: AC
  Administered 2019-07-25: 0.5 mL via INTRAMUSCULAR
  Filled 2019-07-25: qty 0.5

## 2019-07-25 MED ORDER — LIDOCAINE-EPINEPHRINE-TETRACAINE (LET) TOPICAL GEL
3.0000 mL | Freq: Once | TOPICAL | Status: AC
  Administered 2019-07-25: 3 mL via TOPICAL
  Filled 2019-07-25: qty 3

## 2019-07-25 MED ORDER — CEPHALEXIN 500 MG PO CAPS: 500.0000 mg | ORAL_CAPSULE | Freq: Four times a day (QID) | ORAL | 0 refills | Status: DC

## 2019-07-25 MED ORDER — DICLOFENAC SODIUM ER 100 MG PO TB24: 100.0000 mg | ORAL_TABLET | Freq: Every day | ORAL | 0 refills | Status: AC

## 2019-07-25 MED ORDER — MORPHINE SULFATE (PF) 4 MG/ML IV SOLN
4.0000 mg | Freq: Once | INTRAVENOUS | Status: AC
  Administered 2019-07-25: 4 mg via INTRAVENOUS
  Filled 2019-07-25: qty 1

## 2019-07-25 MED ORDER — LIDOCAINE-EPINEPHRINE-TETRACAINE (LET) TOPICAL GEL: 3.0000 mL | Freq: Once | TOPICAL | Status: DC

## 2019-07-25 MED ORDER — OXYCODONE-ACETAMINOPHEN 5-325 MG PO TABS
1.0000 | ORAL_TABLET | Freq: Once | ORAL | Status: AC
  Administered 2019-07-25: 1 via ORAL
  Filled 2019-07-25: qty 1

## 2019-07-25 MED ORDER — OXYCODONE-ACETAMINOPHEN 5-325 MG PO TABS: 1.0000 | ORAL_TABLET | Freq: Four times a day (QID) | ORAL | 0 refills | Status: DC | PRN

## 2019-07-25 MED ORDER — ACETAMINOPHEN 325 MG PO TABS
650.0000 mg | ORAL_TABLET | Freq: Once | ORAL | Status: AC
  Administered 2019-07-25: 650 mg via ORAL
  Filled 2019-07-25: qty 2

## 2019-07-25 MED ORDER — KETOROLAC TROMETHAMINE 15 MG/ML IJ SOLN
15.0000 mg | Freq: Once | INTRAMUSCULAR | Status: AC
  Administered 2019-07-25: 15 mg via INTRAVENOUS
  Filled 2019-07-25: qty 1

## 2019-07-25 MED ORDER — IOHEXOL 300 MG/ML  SOLN
100.0000 mL | Freq: Once | INTRAMUSCULAR | Status: AC | PRN
  Administered 2019-07-25: 100 mL via INTRAVENOUS

## 2019-07-25 MED ORDER — ONDANSETRON HCL 4 MG/2ML IJ SOLN
4.0000 mg | Freq: Once | INTRAMUSCULAR | Status: AC
  Administered 2019-07-25: 4 mg via INTRAVENOUS
  Filled 2019-07-25: qty 2

## 2019-07-25 MED ORDER — METHOCARBAMOL 500 MG PO TABS: 500.0000 mg | ORAL_TABLET | Freq: Two times a day (BID) | ORAL | 0 refills | Status: AC

## 2019-07-25 MED ORDER — CEFAZOLIN SODIUM-DEXTROSE 1-4 GM/50ML-% IV SOLN
1.0000 g | Freq: Once | INTRAVENOUS | Status: AC
  Administered 2019-07-25: 1 g via INTRAVENOUS
  Filled 2019-07-25: qty 50

## 2019-07-25 MED ORDER — ONDANSETRON HCL 4 MG/2ML IJ SOLN
INTRAMUSCULAR | Status: AC | PRN
  Administered 2019-07-25: 4 mg via INTRAVENOUS
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
## 2019-07-25 NOTE — ED Notes (Signed)
Pt and RN to CT 
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
## 2019-07-25 NOTE — ED Provider Notes (Signed)
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 out,Inability to tolerate liquids or food, cough, altered mental status or any concerns. No signs of systemic illness or infection. The patient is nontoxic-appearing on exam and vital signs are within normal limits.   I have reviewed the triage vital signs and the nursing notes. Pertinent labs &imaging results that were available during my care of the patient were reviewed by me and considered in my medical decision making (see chart for details).  After history, exam, and medical workup I feel the patient has been appropriately medically screened and is safe for discharge home. Pertinent diagnoses were discussed with the patient. Patient was given return precautions   Tomasina Keasling, MD 07/25/19 [phone]
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
## 2022-05-23 ENCOUNTER — Emergency Department (HOSPITAL_COMMUNITY)
Admission: EM | Admit: 2022-05-23 | Discharge: 2022-05-23 | Disposition: A | Discharge status: Home / Self Care | Payer: Self-pay | Attending: Emergency Medicine | Admitting: Emergency Medicine

## 2022-05-23 ENCOUNTER — Other Ambulatory Visit: Payer: Self-pay

## 2022-05-23 DIAGNOSIS — R21 Rash and other nonspecific skin eruption: Secondary | ICD-10-CM | POA: Insufficient documentation

## 2022-05-23 DIAGNOSIS — L298 Other pruritus: Secondary | ICD-10-CM | POA: Insufficient documentation

## 2022-05-23 MED ORDER — DIPHENHYDRAMINE HCL 25 MG PO CAPS
50.0000 mg | ORAL_CAPSULE | Freq: Once | ORAL | Status: AC
  Administered 2022-05-23: 50 mg via ORAL
  Filled 2022-05-23: qty 2

## 2022-05-23 MED ORDER — FAMOTIDINE 20 MG PO TABS
40.0000 mg | ORAL_TABLET | Freq: Once | ORAL | Status: AC
  Administered 2022-05-23: 40 mg via ORAL
  Filled 2022-05-23: qty 2

## 2022-05-23 MED ORDER — PREDNISONE 20 MG PO TABS
60.0000 mg | ORAL_TABLET | Freq: Once | ORAL | Status: AC
  Administered 2022-05-23: 60 mg via ORAL
  Filled 2022-05-23: qty 3

## 2022-05-23 MED ORDER — PREDNISONE 20 MG PO TABS: ORAL_TABLET | ORAL | 0 refills | Status: DC

## 2022-05-23 NOTE — ED Notes (Signed)
Deneise Lever, RN charge notified

## 2022-05-23 NOTE — Discharge Instructions (Addendum)
Return for feeling like you mass pass out, trouble breathing or swallowing.

## 2022-05-23 NOTE — ED Triage Notes (Signed)
Pt. Stated, I started having a rash, faintness, and sometimes difficulty swallowing , it started 30 minutes ago. Im itching all over. I had this a couple of weeks ago  but not as bad.

## 2022-05-23 NOTE — ED Triage Notes (Addendum)
Pt. Stated, Last time I took Benadryl and later it went away.

## 2022-05-23 NOTE — ED Provider Notes (Signed)
[facility] EMERGENCY DEPARTMENT Provider Note   CSN: [id number] Arrival date & time: 05/23/22  3716     History  Chief Complaint  Patient presents with   Allergic Reaction   Rash   hard to swallow   Pruritis    Rodney White is a 36 y.o. male.  36 yo M with a chief complaint of an itchy rash.  Tells me that he has developed a rash off and on.  Had one about a week ago and then resolved spontaneously after taking some Benadryl.  He had recurrence this morning.  He had just gotten off of work he was hanging out with some friends and then noticed it.  Feels like his throat is maybe a bit sore.  Itching mostly to the arms and legs.   Allergic Reaction Presenting symptoms: rash   Rash      Home Medications Prior to Admission medications   Medication Sig Start Date End Date Taking? Authorizing Provider  predniSONE (DELTASONE) 20 MG tablet 2 tabs po daily x 4 days 05/23/22  Yes Melene Plan, DO  amoxicillin-clavulanate (AUGMENTIN) 875-125 MG tablet Take 1 tablet by mouth every 12 (twelve) hours. Patient not taking: Reported on 07/25/2019 06/12/17   Lavera Guise, MD  benzonatate (TESSALON) 100 MG capsule Take 1 capsule (100 mg total) by mouth 3 (three) times daily as needed for cough. Patient not taking: Reported on 07/25/2019 11/25/17   Michela Pitcher A, PA-C  cephALEXin (KEFLEX) 500 MG capsule Take 1 capsule (500 mg total) by mouth 4 (four) times daily. 07/25/19   Palumbo, April, MD  cyclobenzaprine (FLEXERIL) 10 MG tablet Take 1 tablet (10 mg total) by mouth 2 (two) times daily as needed for muscle spasms. Patient not taking: Reported on 07/25/2019 12/22/18   Hedges, Tinnie Gens, PA-C  Diclofenac Sodium CR 100 MG 24 hr tablet Take 1 tablet (100 mg total) by mouth daily. 07/25/19   Palumbo, April, MD  dicyclomine (BENTYL) 20 MG tablet Take 1 tablet (20 mg total) by mouth 2 (two) times daily as needed for spasms. Patient not taking: Reported on 07/25/2019 08/31/14   Loren Racer, MD  diphenhydrAMINE (BENADRYL) 25 MG tablet Take 1 tablet (25 mg total) by mouth every 6 (six) hours. Take every 6 hours for 3 days, then as needed if symptoms return Patient taking differently: Take 25 mg by mouth as needed (for allergic reactions).  03/31/18   Elpidio Anis, PA-C  doxycycline (VIBRAMYCIN) 100 MG capsule Take 1 capsule (100 mg total) by mouth 2 (two) times daily. Patient not taking: Reported on 07/25/2019 07/12/16   Janne Napoleon, NP  EPINEPHrine (EPIPEN 2-PAK) 0.3 mg/0.3 mL IJ SOAJ injection Inject 0.3 mLs (0.3 mg total) into the muscle as needed for anaphylaxis. 10/21/18   Ward, Chase Picket, PA-C  famotidine (PEPCID) 20 MG tablet Take 1 tablet (20 mg total) by mouth 2 (two) times daily. Take twice daily for 3 days then as needed for recurrent symptoms Patient not taking: Reported on 07/25/2019 03/31/18   Elpidio Anis, PA-C  HYDROcodone-acetaminophen (NORCO/VICODIN) 5-325 MG tablet Take 1-2 tablets by mouth every 6 (six) hours as needed for moderate pain or severe pain. Patient not taking: Reported on 07/25/2019 06/12/17   Lavera Guise, MD  ibuprofen (ADVIL,MOTRIN) 600 MG tablet Take 1 tablet (600 mg total) by mouth every 6 (six) hours as needed. Patient not taking: Reported on 07/25/2019 07/12/16   Janne Napoleon, NP  methocarbamol (ROBAXIN) 500 MG tablet Take  1 tablet (500 mg total) by mouth 2 (two) times daily. 07/25/19   Palumbo, April, MD  ondansetron (ZOFRAN ODT) 4 MG disintegrating tablet Take 1 tablet (4 mg total) by mouth every 8 (eight) hours as needed for nausea or vomiting. Patient not taking: Reported on 07/25/2019 04/03/19   Sherol Dade E, PA-C  ondansetron (ZOFRAN) 4 MG tablet Take 1 tablet (4 mg total) by mouth every 8 (eight) hours as needed for nausea or vomiting. Patient not taking: Reported on 07/25/2019 11/25/17   Rodell Perna A, PA-C  oxyCODONE-acetaminophen (PERCOCET) 5-325 MG tablet Take 1 tablet by mouth every 6 (six) hours as needed. 07/25/19    Palumbo, April, MD      Allergies    Fruit & vegetable daily [nutritional supplements]    Review of Systems   Review of Systems  Skin:  Positive for rash.    Physical Exam Updated Vital Signs BP (!) 133/90 (BP Location: Right Arm)   Pulse (!) 108   Temp 99 F (37.2 C) (Oral)   Resp (!) 23   Ht 5\' 6"  (1.676 m)   Wt 74.8 kg   SpO2 100%   BMI 26.63 kg/m  Physical Exam Vitals and nursing note reviewed.  Constitutional:      Appearance: He is well-developed.  HENT:     Head: Normocephalic and atraumatic.  Eyes:     Pupils: Pupils are equal, round, and reactive to light.  Neck:     Vascular: No JVD.  Cardiovascular:     Rate and Rhythm: Normal rate and regular rhythm.     Heart sounds: No murmur heard.    No friction rub. No gallop.  Pulmonary:     Effort: No respiratory distress.     Breath sounds: No wheezing.  Abdominal:     General: There is no distension.     Tenderness: There is no abdominal tenderness. There is no guarding or rebound.  Musculoskeletal:        General: Normal range of motion.     Cervical back: Normal range of motion and neck supple.  Skin:    Coloration: Skin is not pale.     Findings: No rash.     Comments: Scattered palpable areas of erythema.  Mostly in skin exposed areas.  Neurological:     Mental Status: He is alert and oriented to person, place, and time.  Psychiatric:        Behavior: Behavior normal.     ED Results / Procedures / Treatments   Labs (all labs ordered are listed, but only abnormal results are displayed) Labs Reviewed - No data to display  EKG None  Radiology No results found.  Procedures Procedures    Medications Ordered in ED Medications  diphenhydrAMINE (BENADRYL) capsule 50 mg (has no administration in time range)  famotidine (PEPCID) tablet 40 mg (has no administration in time range)  predniSONE (DELTASONE) tablet 60 mg (has no administration in time range)    ED Course/ Medical Decision Making/  A&P                           Medical Decision Making Risk Prescription drug management.   36 yo M with a chief complaints of an itchy rash.  He is concerned this could be an allergic reaction.  More on skin exposed areas, I wonder if this is insect bites.  He has no second system involvement.  Not meeting true definition of  anaphylaxis.  He appears well.  We will give him oral dosing of steroids and antihistamines.  Burst dose of prednisone.  Have him follow-up with his family doctor.  8:17 AM:  I have discussed the diagnosis/risks/treatment options with the patient.  Evaluation and diagnostic testing in the emergency department does not suggest an emergent condition requiring admission or immediate intervention beyond what has been performed at this time.  They will follow up with  PCP. We also discussed returning to the ED immediately if new or worsening sx occur. We discussed the sx which are most concerning (e.g., sudden worsening pain, fever, inability to tolerate by mouth, sob, wheezing, vomiting, diarrhea, feeling like he may pass out) that necessitate immediate return. Medications administered to the patient during their visit and any new prescriptions provided to the patient are listed below.  Medications given during this visit Medications  diphenhydrAMINE (BENADRYL) capsule 50 mg (has no administration in time range)  famotidine (PEPCID) tablet 40 mg (has no administration in time range)  predniSONE (DELTASONE) tablet 60 mg (has no administration in time range)     The patient appears reasonably screen and/or stabilized for discharge and I doubt any other medical condition or other [facility] requiring further screening, evaluation, or treatment in the ED at this time prior to discharge.          Final Clinical Impression(s) / ED Diagnoses Final diagnoses:  Rash    Rx / DC Orders ED Discharge Orders          Ordered    predniSONE (DELTASONE) 20 MG tablet        05/23/22  0813              Melene Plan, DO 05/23/22 [phone]

## 2022-07-20 ENCOUNTER — Emergency Department (HOSPITAL_COMMUNITY)
Admission: EM | Admit: 2022-07-20 | Discharge: 2022-07-20 | Disposition: A | Discharge status: Home / Self Care | Payer: Self-pay | Attending: Emergency Medicine | Admitting: Emergency Medicine

## 2022-07-20 ENCOUNTER — Encounter (HOSPITAL_COMMUNITY): Payer: Self-pay

## 2022-07-20 ENCOUNTER — Other Ambulatory Visit: Payer: Self-pay

## 2022-07-20 DIAGNOSIS — T7840XA Allergy, unspecified, initial encounter: Secondary | ICD-10-CM | POA: Insufficient documentation

## 2022-07-20 DIAGNOSIS — L509 Urticaria, unspecified: Secondary | ICD-10-CM | POA: Insufficient documentation

## 2022-07-20 MED ORDER — PREDNISONE 10 MG PO TABS: 20.0000 mg | ORAL_TABLET | Freq: Every day | ORAL | 0 refills | Status: AC

## 2022-07-20 MED ORDER — PREDNISONE 20 MG PO TABS
60.0000 mg | ORAL_TABLET | Freq: Once | ORAL | Status: AC
  Administered 2022-07-20: 60 mg via ORAL
  Filled 2022-07-20: qty 3

## 2022-07-20 MED ORDER — EPINEPHRINE 0.3 MG/0.3ML IJ SOAJ: 0.3000 mg | INTRAMUSCULAR | 0 refills | Status: AC | PRN

## 2022-07-20 MED ORDER — FAMOTIDINE 20 MG PO TABS
40.0000 mg | ORAL_TABLET | Freq: Once | ORAL | Status: AC
  Administered 2022-07-20: 40 mg via ORAL
  Filled 2022-07-20: qty 2

## 2022-07-20 NOTE — ED Provider Notes (Signed)
[facility]-EMERGENCY DEPT Provider Note   CSN: [id number] Arrival date & time: 07/20/22  1324     History  Chief Complaint  Patient presents with   Allergic Reaction    Rodney White is a 36 y.o. male presents to the ED with complaint of hives with itching and burning that began this morning after he was taking a shower.  He states he did not have any changes to his routine and has not come into contact with any new products, only had a beer after work before going home.  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  He states the hives started on his arms and quickly spread to his legs and torso.  Also reports his throat feels "itchy".  Denies shortness of breath, chest pain, wheezing, chest tightness, syncope, lightheadedness, difficulty swallowing, angioedema, palpitations.       Home Medications Prior to Admission medications   Medication Sig Start Date End Date Taking? Authorizing Provider  predniSONE (DELTASONE) 10 MG tablet Take 2 tablets (20 mg total) by mouth daily for 3 days. 07/20/22 07/23/22 Yes Eyonna Sandstrom R, PA  amoxicillin-clavulanate (AUGMENTIN) 875-125 MG tablet Take 1 tablet by mouth every 12 (twelve) hours. Patient not taking: Reported on 07/25/2019 06/12/17   Lavera Guise, MD  benzonatate (TESSALON) 100 MG capsule Take 1 capsule (100 mg total) by mouth 3 (three) times daily as needed for cough. Patient not taking: Reported on 07/25/2019 11/25/17   Michela Pitcher A, PA-C  cephALEXin (KEFLEX) 500 MG capsule Take 1 capsule (500 mg total) by mouth 4 (four) times daily. 07/25/19   Palumbo, April, MD  cyclobenzaprine (FLEXERIL) 10 MG tablet Take 1 tablet (10 mg total) by mouth 2 (two) times daily as needed for muscle spasms. Patient not taking: Reported on 07/25/2019 12/22/18   Hedges, Tinnie Gens, PA-C  Diclofenac Sodium CR 100 MG 24 hr tablet Take 1 tablet (100 mg total) by mouth daily. 07/25/19   Palumbo, April, MD  dicyclomine  (BENTYL) 20 MG tablet Take 1 tablet (20 mg total) by mouth 2 (two) times daily as needed for spasms. Patient not taking: Reported on 07/25/2019 08/31/14   Loren Racer, MD  diphenhydrAMINE (BENADRYL) 25 MG tablet Take 1 tablet (25 mg total) by mouth every 6 (six) hours. Take every 6 hours for 3 days, then as needed if symptoms return Patient taking differently: Take 25 mg by mouth as needed (for allergic reactions).  03/31/18   Elpidio Anis, PA-C  doxycycline (VIBRAMYCIN) 100 MG capsule Take 1 capsule (100 mg total) by mouth 2 (two) times daily. Patient not taking: Reported on 07/25/2019 07/12/16   Janne Napoleon, NP  EPINEPHrine (EPIPEN 2-PAK) 0.3 mg/0.3 mL IJ SOAJ injection Inject 0.3 mg into the muscle as needed for anaphylaxis. 07/20/22   Dorrine Montone R, PA  famotidine (PEPCID) 20 MG tablet Take 1 tablet (20 mg total) by mouth 2 (two) times daily. Take twice daily for 3 days then as needed for recurrent symptoms Patient not taking: Reported on 07/25/2019 03/31/18   Elpidio Anis, PA-C  HYDROcodone-acetaminophen (NORCO/VICODIN) 5-325 MG tablet Take 1-2 tablets by mouth every 6 (six) hours as needed for moderate pain or severe pain. Patient not taking: Reported on 07/25/2019 06/12/17   Lavera Guise, MD  ibuprofen (ADVIL,MOTRIN) 600 MG tablet Take 1 tablet (600 mg total) by mouth every 6 (six) hours as needed. Patient not taking: Reported on 07/25/2019 07/12/16   Janne Napoleon,  NP  methocarbamol (ROBAXIN) 500 MG tablet Take 1 tablet (500 mg total) by mouth 2 (two) times daily. 07/25/19   Palumbo, April, MD  ondansetron (ZOFRAN ODT) 4 MG disintegrating tablet Take 1 tablet (4 mg total) by mouth every 8 (eight) hours as needed for nausea or vomiting. Patient not taking: Reported on 07/25/2019 04/03/19   Namon Cirri E, PA-C  ondansetron (ZOFRAN) 4 MG tablet Take 1 tablet (4 mg total) by mouth every 8 (eight) hours as needed for nausea or vomiting. Patient not taking: Reported on 07/25/2019 11/25/17    Michela Pitcher A, PA-C  oxyCODONE-acetaminophen (PERCOCET) 5-325 MG tablet Take 1 tablet by mouth every 6 (six) hours as needed. 07/25/19   Palumbo, April, MD      Allergies    Fruit & vegetable daily [nutritional supplements]    Review of Systems   Review of Systems  HENT:  Negative for trouble swallowing.        "Itchy throat"  Respiratory:  Negative for chest tightness, shortness of breath and wheezing.   Cardiovascular:  Negative for chest pain and palpitations.  Skin:  Positive for rash.       Hives to extremities and torso   Neurological:  Negative for syncope and light-headedness.    Physical Exam Updated Vital Signs BP (!) 130/91   Pulse 100   Temp 98.5 F (36.9 C) (Oral)   Resp 20   Ht 5\' 6"  (1.676 m) Comment: Simultaneous filing. User may not have seen previous data.  Wt 74.8 kg Comment: Simultaneous filing. User may not have seen previous data.  SpO2 98%   BMI 26.63 kg/m  Physical Exam Vitals and nursing note reviewed.  Constitutional:      General: He is not in acute distress.    Appearance: Normal appearance. He is not ill-appearing or diaphoretic.  HENT:     Mouth/Throat:     Mouth: Mucous membranes are moist. No angioedema.     Pharynx: Oropharynx is clear. Uvula midline. No pharyngeal swelling.  Cardiovascular:     Rate and Rhythm: Normal rate and regular rhythm.     Heart sounds: Normal heart sounds.  Pulmonary:     Effort: Pulmonary effort is normal. No accessory muscle usage or respiratory distress.     Breath sounds: Normal breath sounds and air entry. No stridor. No decreased breath sounds or wheezing.  Skin:    General: Skin is warm and dry.     Findings: Rash present. Rash is urticarial.     Comments: Widespread uritcarial rash to upper and lower extremities and torso (anterior and posterior)   Neurological:     Mental Status: He is alert. Mental status is at baseline.  Psychiatric:        Mood and Affect: Mood normal.        Behavior:  Behavior normal.     ED Results / Procedures / Treatments   Labs (all labs ordered are listed, but only abnormal results are displayed) Labs Reviewed - No data to display  EKG None  Radiology No results found.  Procedures Procedures    Medications Ordered in ED Medications  predniSONE (DELTASONE) tablet 60 mg (60 mg Oral Given 07/20/22 0725)  famotidine (PEPCID) tablet 40 mg (40 mg Oral Given 07/20/22 14/2/23)    ED Course/ Medical Decision Making/ A&P                           Medical Decision Making  Risk Prescription drug management.   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    The differential diagnosis includes anaphylaxis, allergic reaction/urticaria, cold urticaria, contact dermatitis  The initial plan is to give patient prednisone and famotidine; patient received 50mg  IV Benadryl from EMS  Additional history obtained: Additional history obtained from EMS  Records reviewed  previous ED visits for allergic reaction/rash  Initial Assessment:   On exam, patient is resting comfortably in chair in triage.  He is not in respiratory distress, no appreciable stridor.  Lungs clear to auscultation bilaterally, no wheezing, good lung volumes.  Oropharynx without edema, no angioedema, patient able to swallow and speak without difficulty.  Widespread urticarial rash to upper and lower extremities, anterior and posterior torso with itching and burning  Independent ECG/labs interpretation:  No laboratory workup warranted at this time  Independent visualization and interpretation of imaging: No imaging warranted at this time, appears to be an allergic reaction without airway compromise  Treatment and Reassessment: Patient  received IV Benadryl from EMS, will treat uritcarial allergic reaction with famotidine and prednisone and monitor patient  Other treatment options considered:   Epinephrine if patient should develop signs of anaphylaxis  Disposition:   After consideration of the diagnostic results and the patients response to treatment, I feel that emergency department workup does not suggest an emergent condition requiring admission or immediate intervention beyond what has been performed at this time.  The patient is safe for discharge and has been instructed to return immediately for worsening symptoms, change in symptoms or any other concerns.  Patient has well documented allergy to fruits with prior history of severe allergic reaction.  He states he has not had an anaphylactic reaction and has never been prescribed an epi pen.  Patient does not have primary care established.  He was seen in ER on 05/23/22 with similar symptoms.  Patient works in an environment with high risk allergen exposure.  Plan to prescribe patient an Epi Pen for anaphylactic reactions and educate patient on when to use it.  Recommended patient establish with primary care as soon as possible for continued follow-up and allergy management.   Discussed HPI, physical exam findings, assessment and plan with attending 07/23/22 who agrees with current plan.          Final Clinical Impression(s) / ED Diagnoses Final diagnoses:  Allergic reaction, initial encounter  Urticaria    Rx / DC Orders ED Discharge Orders          Ordered    predniSONE (DELTASONE) 10 MG tablet  Daily        07/20/22 0842    EPINEPHrine (EPIPEN 2-PAK) 0.3 mg/0.3 mL IJ SOAJ injection  As needed        07/20/22 0842              14/02/23, PA 07/20/22 0845    14/02/23, MD 07/20/22 1235

## 2022-07-20 NOTE — Discharge Instructions (Addendum)
Thank you for allowing me to be part of your care today.  You were seen in the ER for an allergic reaction and treated with a steroid and an allergy medication.  I am sending you home on a steroid prescription for the next few days to help resolve your hives.  Please take as prescribed.   You can call [facility] number to establish with primary care. They can refer you to an allergist. Please follow up with a doctor in the next 2-3 weeks.  I have also prescribed you an Epi Pen for use if you develop signs of anaphylaxis - hives with swelling of your tongue, shortness of breath, throat tightness or itching or feeling like you cannot swallow.  When you have these symptoms, use the Epi Pen and call 911 for emergency medical care.  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   Return to the ER if you develop new or worsening symptoms or if you have any new concerns.

## 2022-07-20 NOTE — ED Triage Notes (Addendum)
Arrives EMS from home. Got in the shower this morning after getting off of work. Noticed generalized hives on upper torso and arms. Throat feels itchy and tight.   Denies any new products or changes to everyday routine.   50mg  benadryl admin pta by fire.

## 2022-10-08 ENCOUNTER — Ambulatory Visit: Payer: Self-pay | Attending: Family Medicine | Admitting: Family Medicine

## 2022-12-22 ENCOUNTER — Emergency Department (HOSPITAL_COMMUNITY)
Admission: EM | Admit: 2022-12-22 | Discharge: 2022-12-22 | Disposition: A | Discharge status: Home / Self Care | Payer: Self-pay | Attending: Emergency Medicine | Admitting: Emergency Medicine

## 2022-12-22 ENCOUNTER — Encounter (HOSPITAL_COMMUNITY): Payer: Self-pay | Admitting: Emergency Medicine

## 2022-12-22 ENCOUNTER — Other Ambulatory Visit: Payer: Self-pay

## 2022-12-22 DIAGNOSIS — J45909 Unspecified asthma, uncomplicated: Secondary | ICD-10-CM | POA: Insufficient documentation

## 2022-12-22 DIAGNOSIS — K0889 Other specified disorders of teeth and supporting structures: Secondary | ICD-10-CM

## 2022-12-22 DIAGNOSIS — F17210 Nicotine dependence, cigarettes, uncomplicated: Secondary | ICD-10-CM | POA: Insufficient documentation

## 2022-12-22 DIAGNOSIS — K029 Dental caries, unspecified: Secondary | ICD-10-CM | POA: Insufficient documentation

## 2022-12-22 MED ORDER — OXYCODONE HCL 5 MG PO TABS: 5.0000 mg | ORAL_TABLET | Freq: Four times a day (QID) | ORAL | 0 refills | Status: DC | PRN

## 2022-12-22 MED ORDER — OXYCODONE-ACETAMINOPHEN 5-325 MG PO TABS
1.0000 | ORAL_TABLET | Freq: Once | ORAL | Status: AC
  Administered 2022-12-22: 1 via ORAL
  Filled 2022-12-22: qty 1

## 2022-12-22 MED ORDER — AMOXICILLIN-POT CLAVULANATE 875-125 MG PO TABS
1.0000 | ORAL_TABLET | Freq: Once | ORAL | Status: AC
  Administered 2022-12-22: 1 via ORAL
  Filled 2022-12-22: qty 1

## 2022-12-22 MED ORDER — AMOXICILLIN-POT CLAVULANATE 875-125 MG PO TABS: 1.0000 | ORAL_TABLET | Freq: Two times a day (BID) | ORAL | 0 refills | Status: DC

## 2022-12-22 MED ORDER — ACETAMINOPHEN 325 MG PO TABS: 650.0000 mg | ORAL_TABLET | Freq: Four times a day (QID) | ORAL | 0 refills | Status: AC | PRN

## 2022-12-22 MED ORDER — LIDOCAINE VISCOUS HCL 2 % MT SOLN
15.0000 mL | Freq: Once | OROMUCOSAL | Status: AC
  Administered 2022-12-22: 15 mL via OROMUCOSAL
  Filled 2022-12-22: qty 15

## 2022-12-22 NOTE — ED Triage Notes (Signed)
Pt arrives via [facility] from home. He c/o Left sided tooth pain, that radiates up to his ear, causing him a headache. En route, 160/90, hr 100, rr 18, 99%

## 2022-12-22 NOTE — ED Provider Notes (Signed)
[place] EMERGENCY DEPARTMENT AT [facility] Provider Note  CSN: [id number] Arrival date & time: 12/22/22 4098  Chief Complaint(s) Dental Pain  HPI Rodney White is a 37 y.o. male with past medical history as below, significant for asthma, tobacco use who presents to the ED with complaint of dental pain left.  Patient began having lower dental pain over the past 2 to 3 days.  He took Tylenol yesterday without much improvement.  No fevers or chills.  No difficulty swallowing or speaking, no difficulty breathing.  Feels pain to left side of his face with radiation to his left cheek and towards his left ear.  No rashes.  No facial trauma.  Does not follow with dentist, smokes cigarettes.  Past Medical History Past Medical History:  Diagnosis Date   Asthma    There are no problems to display for this patient.  Home Medication(s) Prior to Admission medications   Medication Sig Start Date End Date Taking? Authorizing Provider  amoxicillin-clavulanate (AUGMENTIN) 875-125 MG tablet Take 1 tablet by mouth every 12 (twelve) hours. Patient not taking: Reported on 07/25/2019 06/12/17   Lavera Guise, MD  benzonatate (TESSALON) 100 MG capsule Take 1 capsule (100 mg total) by mouth 3 (three) times daily as needed for cough. Patient not taking: Reported on 07/25/2019 11/25/17   Michela Pitcher A, PA-C  cephALEXin (KEFLEX) 500 MG capsule Take 1 capsule (500 mg total) by mouth 4 (four) times daily. 07/25/19   Palumbo, April, MD  cyclobenzaprine (FLEXERIL) 10 MG tablet Take 1 tablet (10 mg total) by mouth 2 (two) times daily as needed for muscle spasms. Patient not taking: Reported on 07/25/2019 12/22/18   Hedges, Tinnie Gens, PA-C  Diclofenac Sodium CR 100 MG 24 hr tablet Take 1 tablet (100 mg total) by mouth daily. 07/25/19   Palumbo, April, MD  dicyclomine (BENTYL) 20 MG tablet Take 1 tablet (20 mg total) by mouth 2 (two) times daily as needed for spasms. Patient not taking: Reported on 07/25/2019 08/31/14    Loren Racer, MD  diphenhydrAMINE (BENADRYL) 25 MG tablet Take 1 tablet (25 mg total) by mouth every 6 (six) hours. Take every 6 hours for 3 days, then as needed if symptoms return Patient taking differently: Take 25 mg by mouth as needed (for allergic reactions).  03/31/18   Elpidio Anis, PA-C  doxycycline (VIBRAMYCIN) 100 MG capsule Take 1 capsule (100 mg total) by mouth 2 (two) times daily. Patient not taking: Reported on 07/25/2019 07/12/16   Janne Napoleon, NP  EPINEPHrine (EPIPEN 2-PAK) 0.3 mg/0.3 mL IJ SOAJ injection Inject 0.3 mg into the muscle as needed for anaphylaxis. 07/20/22   Clark, Meghan R, PA-C  famotidine (PEPCID) 20 MG tablet Take 1 tablet (20 mg total) by mouth 2 (two) times daily. Take twice daily for 3 days then as needed for recurrent symptoms Patient not taking: Reported on 07/25/2019 03/31/18   Elpidio Anis, PA-C  HYDROcodone-acetaminophen (NORCO/VICODIN) 5-325 MG tablet Take 1-2 tablets by mouth every 6 (six) hours as needed for moderate pain or severe pain. Patient not taking: Reported on 07/25/2019 06/12/17   Lavera Guise, MD  ibuprofen (ADVIL,MOTRIN) 600 MG tablet Take 1 tablet (600 mg total) by mouth every 6 (six) hours as needed. Patient not taking: Reported on 07/25/2019 07/12/16   Janne Napoleon, NP  methocarbamol (ROBAXIN) 500 MG tablet Take 1 tablet (500 mg total) by mouth 2 (two) times daily. 07/25/19   Palumbo, April, MD  ondansetron (ZOFRAN ODT) 4 MG  disintegrating tablet Take 1 tablet (4 mg total) by mouth every 8 (eight) hours as needed for nausea or vomiting. Patient not taking: Reported on 07/25/2019 04/03/19   Namon Cirri E, PA-C  ondansetron (ZOFRAN) 4 MG tablet Take 1 tablet (4 mg total) by mouth every 8 (eight) hours as needed for nausea or vomiting. Patient not taking: Reported on 07/25/2019 11/25/17   Michela Pitcher A, PA-C  oxyCODONE-acetaminophen (PERCOCET) 5-325 MG tablet Take 1 tablet by mouth every 6 (six) hours as needed. 07/25/19   Palumbo,  April, MD                                                                                                                                    Past Surgical History History reviewed. No pertinent surgical history. Family History History reviewed. No pertinent family history.  Social History Social History   Tobacco Use   Smoking status: Every Day    Packs/day: .5    Types: Cigarettes   Smokeless tobacco: Never  Substance Use Topics   Alcohol use: Yes   Drug use: Yes    Types: Marijuana   Allergies Fruit & vegetable daily [nutritional supplements] and Ibuprofen  Review of Systems Review of Systems  Constitutional:  Negative for chills and fever.  HENT:  Positive for dental problem. Negative for facial swelling and trouble swallowing.   Eyes:  Negative for photophobia and visual disturbance.  Respiratory:  Negative for cough and shortness of breath.   Cardiovascular:  Negative for chest pain and palpitations.  Gastrointestinal:  Negative for abdominal pain, nausea and vomiting.  Endocrine: Negative for polydipsia and polyuria.  Genitourinary:  Negative for difficulty urinating and hematuria.  Musculoskeletal:  Negative for gait problem and joint swelling.  Skin:  Negative for pallor and rash.  Neurological:  Negative for syncope and headaches.  Psychiatric/Behavioral:  Negative for agitation and confusion.     Physical Exam Vital Signs  I have reviewed the triage vital signs BP (!) 135/99 (BP Location: Left Arm)   Pulse 91   Temp 98.5 F (36.9 C) (Oral)   SpO2 99%  Physical Exam Vitals and nursing note reviewed.  Constitutional:      General: He is not in acute distress.    Appearance: He is well-developed.  HENT:     Head: Normocephalic and atraumatic.     Jaw: There is normal jaw occlusion. No trismus or malocclusion.     Right Ear: External ear normal.     Left Ear: External ear normal.     Mouth/Throat:     Mouth: Mucous membranes are moist.     Pharynx:  Oropharynx is clear. Uvula midline.      Comments: Dental caries noted, no obvious drainable dental abscess.  No sublingual swelling.  uvula is midline, no drooling stridor or trismus Eyes:     General: No scleral icterus.       Right eye:  No discharge.        Left eye: No discharge.     Extraocular Movements: Extraocular movements intact.     Conjunctiva/sclera: Conjunctivae normal.     Pupils: Pupils are equal, round, and reactive to light.  Cardiovascular:     Rate and Rhythm: Normal rate and regular rhythm.  Pulmonary:     Effort: Pulmonary effort is normal. No respiratory distress.     Breath sounds: No stridor.  Abdominal:     General: Abdomen is flat.     Palpations: Abdomen is soft.     Tenderness: There is no abdominal tenderness.  Musculoskeletal:        General: No deformity.  Skin:    General: Skin is warm and dry.     Capillary Refill: Capillary refill takes less than 2 seconds.  Neurological:     Mental Status: He is alert and oriented to person, place, and time.  Psychiatric:        Mood and Affect: Mood normal.        Behavior: Behavior normal.     ED Results and Treatments Labs (all labs ordered are listed, but only abnormal results are displayed) Labs Reviewed - No data to display                                                                                                                        Radiology No results found.  Pertinent labs & imaging results that were available during my care of the patient were reviewed by me and considered in my medical decision making (see MDM for details).  Medications Ordered in ED Medications  lidocaine (XYLOCAINE) 2 % viscous mouth solution 15 mL (has no administration in time range)  oxyCODONE-acetaminophen (PERCOCET/ROXICET) 5-325 MG per tablet 1 tablet (has no administration in time range)  amoxicillin-clavulanate (AUGMENTIN) 875-125 MG per tablet 1 tablet (has no administration in time range)                                                                                                                                      Procedures Procedures  (including critical care time)  Medical Decision Making / ED Course    Medical Decision Making:    Cye Einstein is a 37 y.o. male with past medical history as below, significant for asthma/tobacco use who presents to the ED with complaint of dental pain left. Marland Kitchen  The complaint involves an extensive differential diagnosis and also carries with it a high risk of complications and morbidity.  Serious etiology was considered. Ddx includes but is not limited to: dental pain, dental caries, dental abscess, soft tissue infection, ludwig angina, etc  Complete initial physical exam performed, notably the patient  was nad, hds, no resp distress.    Reviewed and confirmed nursing documentation for past medical history, family history, social history.  Vital signs reviewed.       Patient presenting for the above primary dental complaint. Possible dental infection, with no signs of facial or intra-oral abscess. Dental caries noted. Pt was started on Abx. ]Given pain meds for home, Tylenol, tid Listerine rinses, Orajel prn, brushing, and given list of outpatient dental resources.    Patient in no distress and overall condition improved here in the ED. Detailed discussions were had with the patient regarding current findings, and need for close f/u with PCP or on call doctor. The patient has been instructed to return immediately if the symptoms worsen in any way for re-evaluation. Patient verbalized understanding and is in agreement with current care plan. All questions answered prior to discharge.       Additional history obtained: -Additional history obtained from na -External records from outside source obtained and reviewed including: Chart review including previous notes, labs, imaging, consultation notes including prior ED visits, home medications, prior  labs and imaging, allergy list   Lab Tests: na  EKG   EKG Interpretation  Date/Time:    Ventricular Rate:    PR Interval:    QRS Duration:   QT Interval:    QTC Calculation:   R Axis:     Text Interpretation:           Imaging Studies ordered: na   Medicines ordered and prescription drug management: Meds ordered this encounter  Medications   lidocaine (XYLOCAINE) 2 % viscous mouth solution 15 mL   oxyCODONE-acetaminophen (PERCOCET/ROXICET) 5-325 MG per tablet 1 tablet   amoxicillin-clavulanate (AUGMENTIN) 875-125 MG per tablet 1 tablet    -I have reviewed the patients home medicines and have made adjustments as needed   Consultations Obtained: na   Cardiac Monitoring: na  Social Determinants of Health:  Diagnosis or treatment significantly limited by social determinants of health: current smoker, alcohol use, and uninsured Counseled patient for approximately 3 minutes regarding smoking cessation. Discussed risks of smoking and how they applied and affected their visit here today. Patient not ready to quit at this time, however will follow up with their primary doctor when they are.   CPT code: 16109: intermediate counseling for smoking cessation     Reevaluation: After the interventions noted above, I reevaluated the patient and found that they have improved  Co morbidities that complicate the patient evaluation  Past Medical History:  Diagnosis Date   Asthma       Dispostion: Disposition decision including need for hospitalization was considered, and patient discharged from emergency department.    Final Clinical Impression(s) / ED Diagnoses Final diagnoses:  Pain, dental  Dental caries     This chart was dictated using voice recognition software.  Despite best efforts to proofread,  errors can occur which can change the documentation meaning.    Sloan Leiter, DO 12/22/22 [phone]

## 2022-12-22 NOTE — Discharge Instructions (Addendum)
Please use the listerine/antiseptic mouthwash 3x daily Use oragel as needed applied topically Please follow up with dentist for further evaluation Please stop smoking  It was a pleasure caring for you today in the emergency department.  Please return to the emergency department for any worsening or worrisome symptoms.

## 2022-12-28 ENCOUNTER — Emergency Department (HOSPITAL_COMMUNITY)
Admission: EM | Admit: 2022-12-28 | Discharge: 2022-12-28 | Disposition: A | Discharge status: Home / Self Care | Payer: Self-pay | Attending: Emergency Medicine | Admitting: Emergency Medicine

## 2022-12-28 DIAGNOSIS — K0889 Other specified disorders of teeth and supporting structures: Secondary | ICD-10-CM | POA: Diagnosis not present

## 2022-12-28 MED ORDER — CLINDAMYCIN HCL 150 MG PO CAPS: 450.0000 mg | ORAL_CAPSULE | Freq: Three times a day (TID) | ORAL | 0 refills | Status: AC

## 2022-12-28 MED ORDER — BUPIVACAINE-EPINEPHRINE (PF) 0.5% -1:200000 IJ SOLN
1.8000 mL | Freq: Once | INTRAMUSCULAR | Status: AC
  Administered 2022-12-28: 1.8 mL
  Filled 2022-12-28: qty 1.8

## 2022-12-28 MED ORDER — KETOROLAC TROMETHAMINE 15 MG/ML IJ SOLN
15.0000 mg | Freq: Once | INTRAMUSCULAR | Status: AC
  Administered 2022-12-28: 15 mg via INTRAMUSCULAR
  Filled 2022-12-28: qty 1

## 2022-12-28 MED ORDER — BENZOCAINE 20 % MT AERO
INHALATION_SPRAY | Freq: Once | OROMUCOSAL | Status: DC
  Filled 2022-12-28: qty 57

## 2022-12-28 NOTE — ED Provider Notes (Signed)
[place] EMERGENCY DEPARTMENT AT [facility] Provider Note   CSN: [id number] Arrival date & time: 12/28/22  0756     History  Chief Complaint  Patient presents with   Dental Pain    Rodney White is a 37 y.o. male.  37 yo M with a chief complaints of left lower dental pain.  This has been going on for the better part of the week.  He was seen in the emergency department at the onset and has been on antibiotics that he has not felt to have helped and symptoms have actually worsened.  He denies fevers.  Feels like the pain extends into his jaw all and then up into his left frontal region.  He denies any trauma.  Has not yet been able to see a dentist.        Home Medications Prior to Admission medications   Medication Sig Start Date End Date Taking? Authorizing Provider  clindamycin (CLEOCIN) 150 MG capsule Take 3 capsules (450 mg total) by mouth 3 (three) times daily for 7 days. 12/28/22 01/04/23 Yes Melene Plan, DO  acetaminophen (TYLENOL) 325 MG tablet Take 2 tablets (650 mg total) by mouth every 6 (six) hours as needed. 12/22/22   Sloan Leiter, DO  benzonatate (TESSALON) 100 MG capsule Take 1 capsule (100 mg total) by mouth 3 (three) times daily as needed for cough. Patient not taking: Reported on 07/25/2019 11/25/17   Michela Pitcher A, PA-C  cephALEXin (KEFLEX) 500 MG capsule Take 1 capsule (500 mg total) by mouth 4 (four) times daily. 07/25/19   Palumbo, April, MD  cyclobenzaprine (FLEXERIL) 10 MG tablet Take 1 tablet (10 mg total) by mouth 2 (two) times daily as needed for muscle spasms. Patient not taking: Reported on 07/25/2019 12/22/18   Hedges, Tinnie Gens, PA-C  Diclofenac Sodium CR 100 MG 24 hr tablet Take 1 tablet (100 mg total) by mouth daily. 07/25/19   Palumbo, April, MD  dicyclomine (BENTYL) 20 MG tablet Take 1 tablet (20 mg total) by mouth 2 (two) times daily as needed for spasms. Patient not taking: Reported on 07/25/2019 08/31/14   Loren Racer, MD  diphenhydrAMINE  (BENADRYL) 25 MG tablet Take 1 tablet (25 mg total) by mouth every 6 (six) hours. Take every 6 hours for 3 days, then as needed if symptoms return Patient taking differently: Take 25 mg by mouth as needed (for allergic reactions).  03/31/18   Elpidio Anis, PA-C  EPINEPHrine (EPIPEN 2-PAK) 0.3 mg/0.3 mL IJ SOAJ injection Inject 0.3 mg into the muscle as needed for anaphylaxis. 07/20/22   Clark, Meghan R, PA-C  famotidine (PEPCID) 20 MG tablet Take 1 tablet (20 mg total) by mouth 2 (two) times daily. Take twice daily for 3 days then as needed for recurrent symptoms Patient not taking: Reported on 07/25/2019 03/31/18   Elpidio Anis, PA-C  HYDROcodone-acetaminophen (NORCO/VICODIN) 5-325 MG tablet Take 1-2 tablets by mouth every 6 (six) hours as needed for moderate pain or severe pain. Patient not taking: Reported on 07/25/2019 06/12/17   Lavera Guise, MD  ibuprofen (ADVIL,MOTRIN) 600 MG tablet Take 1 tablet (600 mg total) by mouth every 6 (six) hours as needed. Patient not taking: Reported on 07/25/2019 07/12/16   Janne Napoleon, NP  methocarbamol (ROBAXIN) 500 MG tablet Take 1 tablet (500 mg total) by mouth 2 (two) times daily. 07/25/19   Palumbo, April, MD  ondansetron (ZOFRAN ODT) 4 MG disintegrating tablet Take 1 tablet (4 mg total) by mouth every  8 (eight) hours as needed for nausea or vomiting. Patient not taking: Reported on 07/25/2019 04/03/19   Namon Cirri E, PA-C  ondansetron (ZOFRAN) 4 MG tablet Take 1 tablet (4 mg total) by mouth every 8 (eight) hours as needed for nausea or vomiting. Patient not taking: Reported on 07/25/2019 11/25/17   Michela Pitcher A, PA-C  oxyCODONE (ROXICODONE) 5 MG immediate release tablet Take 1 tablet (5 mg total) by mouth every 6 (six) hours as needed for severe pain. 12/22/22   Sloan Leiter, DO  oxyCODONE-acetaminophen (PERCOCET) 5-325 MG tablet Take 1 tablet by mouth every 6 (six) hours as needed. 07/25/19   Palumbo, April, MD      Allergies    Fruit & vegetable  daily [nutritional supplements] and Ibuprofen    Review of Systems   Review of Systems  Physical Exam Updated Vital Signs BP (!) 162/108 (BP Location: Right Arm)   Pulse 68   Temp 98.1 F (36.7 C) (Oral)   Resp 16   Ht 5\' 6"  (1.676 m)   Wt 81.6 kg   SpO2 98%   BMI 29.05 kg/m  Physical Exam Vitals and nursing note reviewed.  Constitutional:      Appearance: He is well-developed.  HENT:     Head: Normocephalic and atraumatic.     Mouth/Throat:     Comments: Patient has mild tenderness with palpation to the posterior molars on the left lower.  There is no obvious edema no obvious induration or fluctuance.  No sublingual swelling.  Tolerating secretions without issue.  No trismus. Eyes:     Pupils: Pupils are equal, round, and reactive to light.  Neck:     Vascular: No JVD.  Cardiovascular:     Rate and Rhythm: Normal rate and regular rhythm.     Heart sounds: No murmur heard.    No friction rub. No gallop.  Pulmonary:     Effort: No respiratory distress.     Breath sounds: No wheezing.  Abdominal:     General: There is no distension.     Tenderness: There is no abdominal tenderness. There is no guarding or rebound.  Musculoskeletal:        General: Normal range of motion.     Cervical back: Normal range of motion and neck supple.  Skin:    Coloration: Skin is not pale.     Findings: No rash.  Neurological:     Mental Status: He is alert and oriented to person, place, and time.  Psychiatric:        Behavior: Behavior normal.     ED Results / Procedures / Treatments   Labs (all labs ordered are listed, but only abnormal results are displayed) Labs Reviewed - No data to display  EKG None  Radiology No results found.  Procedures Dental Block  Date/Time: 12/28/2022 8:52 AM  Performed by: Melene Plan, DO Authorized by: Melene Plan, DO   Consent:    Consent obtained:  Verbal   Consent given by:  Patient   Risks, benefits, and alternatives were discussed:  yes     Risks discussed:  Allergic reaction, infection, nerve damage, hematoma, intravascular injection and pain   Alternatives discussed:  No treatment and delayed treatment Universal protocol:    Procedure explained and questions answered to patient or proxy's satisfaction: yes     Patient identity confirmed:  Verbally with patient Indications:    Indications: dental pain   Location:    Block type:  Inferior alveolar  Laterality:  Left Procedure details:    Topical anesthetic:  Benzocaine gel   Syringe type:  Aspirating dental syringe   Needle gauge:  27 G   Anesthetic injected:  Bupivacaine 0.5% WITH epi   Injection procedure:  Anatomic landmarks identified, anatomic landmarks palpated, introduced needle and negative aspiration for blood Post-procedure details:    Outcome:  Pain improved   Procedure completion:  Tolerated well, no immediate complications     Medications Ordered in ED Medications  Benzocaine (HURRCAINE) 20 % mouth spray (has no administration in time range)  bupivacaine-epinephrine (PF) (MARCAINE W/ EPI) 0.5% -1:200000 injection 1.8 mL (1.8 mLs Infiltration Given 12/28/22 0831)  ketorolac (TORADOL) 15 MG/ML injection 15 mg (15 mg Intramuscular Given 12/28/22 1610)    ED Course/ Medical Decision Making/ A&P                             Medical Decision Making Risk Prescription drug management.   37 yo M with a chief complaint of left lower dental pain.  This has been going on for the better part of the week.  He feels like it is getting worse despite being on antibiotics after his last ED visit.  Has not been able to see a dentist.  On my exam I do not see any obvious reason for this severe dental discomfort.  Will perform a dental block and reassess.  Dental block performed with some improvement of his discomfort.  Continues to have no trismus.  Repeat exam without obvious concern for deep space abscess.  Will change his antibiotic therapy.  Encouraged him to  follow-up with a dentist.  8:53 AM:  I have discussed the diagnosis/risks/treatment options with the patient.  Evaluation and diagnostic testing in the emergency department does not suggest an emergent condition requiring admission or immediate intervention beyond what has been performed at this time.  They will follow up with PCP, dentistry. We also discussed returning to the ED immediately if new or worsening sx occur. We discussed the sx which are most concerning (e.g., sudden worsening pain, fever, inability to tolerate by mouth) that necessitate immediate return. Medications administered to the patient during their visit and any new prescriptions provided to the patient are listed below.  Medications given during this visit Medications  Benzocaine (HURRCAINE) 20 % mouth spray (has no administration in time range)  bupivacaine-epinephrine (PF) (MARCAINE W/ EPI) 0.5% -1:200000 injection 1.8 mL (1.8 mLs Infiltration Given 12/28/22 0831)  ketorolac (TORADOL) 15 MG/ML injection 15 mg (15 mg Intramuscular Given 12/28/22 0829)     The patient appears reasonably screen and/or stabilized for discharge and I doubt any other medical condition or other [facility] requiring further screening, evaluation, or treatment in the ED at this time prior to discharge.          Final Clinical Impression(s) / ED Diagnoses Final diagnoses:  Pain, dental    Rx / DC Orders ED Discharge Orders          Ordered    clindamycin (CLEOCIN) 150 MG capsule  3 times daily        12/28/22 0850              Melene Plan, DO 12/28/22 [phone]

## 2022-12-28 NOTE — ED Triage Notes (Signed)
Pt with left lower tooth pain radiating to ear and head for the last week. Taking antibiotics and pain medication with little relief

## 2022-12-28 NOTE — Discharge Instructions (Signed)
As we discussed please call the dentist on Monday to try and schedule an appointment.  I have changed your antibiotics stop taking the antibiotic you are prescribed the other day and start taking the clindamycin.  Please return for rapid swelling or if you develop a fever or you have difficulty swallowing.  You have a listed allergy for ibuprofen though it looks like you have taken diclofenac in the past.  If you are willing I think you should try naproxen, the training for this is Aleve. Max dosing below.   Take 2 over-the-counter naproxen tablets twice a day for pain. Also take tylenol 1000mg (2 extra strength) four times a day.

## 2022-12-30 ENCOUNTER — Encounter (HOSPITAL_COMMUNITY): Payer: Self-pay

## 2022-12-30 ENCOUNTER — Other Ambulatory Visit: Payer: Self-pay

## 2022-12-30 ENCOUNTER — Emergency Department (HOSPITAL_COMMUNITY)
Admission: EM | Admit: 2022-12-30 | Discharge: 2022-12-31 | Disposition: A | Discharge status: Home / Self Care | Payer: Self-pay | Attending: Emergency Medicine | Admitting: Emergency Medicine

## 2022-12-30 DIAGNOSIS — K0889 Other specified disorders of teeth and supporting structures: Secondary | ICD-10-CM | POA: Insufficient documentation

## 2022-12-30 NOTE — ED Triage Notes (Signed)
Left sided mouth pain x 1 week ran out of pain meds

## 2022-12-31 MED ORDER — TRAMADOL HCL 50 MG PO TABS: 50.0000 mg | ORAL_TABLET | Freq: Four times a day (QID) | ORAL | 0 refills | Status: AC | PRN

## 2022-12-31 MED ORDER — TRAMADOL HCL 50 MG PO TABS
50.0000 mg | ORAL_TABLET | Freq: Once | ORAL | Status: AC
  Administered 2022-12-31: 50 mg via ORAL
  Filled 2022-12-31: qty 1

## 2022-12-31 NOTE — Discharge Instructions (Addendum)
You were seen in the emergency department today for dental pain.  Please try to fill your prescription for clindamycin as soon as possible and begin taking this medication.  Additionally have prescribed you a course of pain medication called tramadol.  You have been prescribed a medication that is considered an opiate. Opiates are pain medications that should be used with caution. It is important that you do not drive while taking this medication as it can cause drowsiness and impaired reaction times. Do not mix this medication with benzodiazepine medications or alcohol as this can cause respiratory depression. Additionally, opiates have addicting properties to them. Please use medication as prescribed by your provider.  Please return to the emergency department if you are having facial swelling or fevers.  Please follow-up with the dentist on Friday for definitive management of your pain symptoms.

## 2022-12-31 NOTE — ED Provider Notes (Signed)
[place] EMERGENCY DEPARTMENT AT [facility] Provider Note   CSN: [id number] Arrival date & time: 12/30/22  2249     History  Chief Complaint  Patient presents with   Dental Pain    Pt arrived via ems from home c/o left sided tooth pain x 1 week seen earlier at other hospital given oxy/tylenol rx ran out of meds    Rodney White is a 37 y.o. male.  With no significant past medical history presents to the emergency department with dental pain.  States that he has had about 1 week of left lower dental pain.  He states he feels like it is his wisdom tooth.  He states that he was seen twice before for the same over the past week.  He has been given antibiotics and initially oxycodone for pain which she states made the pain worse.  He states that he was seen again and antibiotics were changed however he has been unable to fill this prescription as he does not get paid until Wednesday.  He also states that he was given a Designer, jewellery and made an appoint with [facility] for Friday.  He is having pain that radiates up the left side of his face.  He denies having any facial swelling, fevers, chills, nausea.  Still tolerating liquids without difficulty.   Dental Pain      Home Medications Prior to Admission medications   Medication Sig Start Date End Date Taking? Authorizing Provider  traMADol (ULTRAM) 50 MG tablet Take 1 tablet (50 mg total) by mouth every 6 (six) hours as needed for up to 3 days. 12/31/22 01/03/23 Yes Cristopher Peru, PA-C  acetaminophen (TYLENOL) 325 MG tablet Take 2 tablets (650 mg total) by mouth every 6 (six) hours as needed. 12/22/22   Sloan Leiter, DO  benzonatate (TESSALON) 100 MG capsule Take 1 capsule (100 mg total) by mouth 3 (three) times daily as needed for cough. Patient not taking: Reported on 07/25/2019 11/25/17   Michela Pitcher A, PA-C  cephALEXin (KEFLEX) 500 MG capsule Take 1 capsule (500 mg total) by mouth 4 (four) times daily. 07/25/19    Palumbo, April, MD  clindamycin (CLEOCIN) 150 MG capsule Take 3 capsules (450 mg total) by mouth 3 (three) times daily for 7 days. 12/28/22 01/04/23  Melene Plan, DO  cyclobenzaprine (FLEXERIL) 10 MG tablet Take 1 tablet (10 mg total) by mouth 2 (two) times daily as needed for muscle spasms. Patient not taking: Reported on 07/25/2019 12/22/18   Hedges, Tinnie Gens, PA-C  Diclofenac Sodium CR 100 MG 24 hr tablet Take 1 tablet (100 mg total) by mouth daily. 07/25/19   Palumbo, April, MD  dicyclomine (BENTYL) 20 MG tablet Take 1 tablet (20 mg total) by mouth 2 (two) times daily as needed for spasms. Patient not taking: Reported on 07/25/2019 08/31/14   Loren Racer, MD  diphenhydrAMINE (BENADRYL) 25 MG tablet Take 1 tablet (25 mg total) by mouth every 6 (six) hours. Take every 6 hours for 3 days, then as needed if symptoms return Patient taking differently: Take 25 mg by mouth as needed (for allergic reactions).  03/31/18   Elpidio Anis, PA-C  EPINEPHrine (EPIPEN 2-PAK) 0.3 mg/0.3 mL IJ SOAJ injection Inject 0.3 mg into the muscle as needed for anaphylaxis. 07/20/22   Clark, Meghan R, PA-C  famotidine (PEPCID) 20 MG tablet Take 1 tablet (20 mg total) by mouth 2 (two) times daily. Take twice daily for 3 days then as needed  for recurrent symptoms Patient not taking: Reported on 07/25/2019 03/31/18   Elpidio Anis, PA-C  ibuprofen (ADVIL,MOTRIN) 600 MG tablet Take 1 tablet (600 mg total) by mouth every 6 (six) hours as needed. Patient not taking: Reported on 07/25/2019 07/12/16   Janne Napoleon, NP  methocarbamol (ROBAXIN) 500 MG tablet Take 1 tablet (500 mg total) by mouth 2 (two) times daily. 07/25/19   Palumbo, April, MD  ondansetron (ZOFRAN ODT) 4 MG disintegrating tablet Take 1 tablet (4 mg total) by mouth every 8 (eight) hours as needed for nausea or vomiting. Patient not taking: Reported on 07/25/2019 04/03/19   Namon Cirri E, PA-C  ondansetron (ZOFRAN) 4 MG tablet Take 1 tablet (4 mg total) by mouth  every 8 (eight) hours as needed for nausea or vomiting. Patient not taking: Reported on 07/25/2019 11/25/17   Michela Pitcher A, PA-C      Allergies    Fruit & vegetable daily [nutritional supplements] and Ibuprofen    Review of Systems   Review of Systems  HENT:  Positive for dental problem.   All other systems reviewed and are negative.   Physical Exam Updated Vital Signs BP (!) 150/98 (BP Location: Right Arm)   Pulse 76   Temp 98.7 F (37.1 C) (Oral)   Resp 20   Ht 5\' 6"  (1.676 m)   Wt 82 kg   SpO2 100%   BMI 29.18 kg/m  Physical Exam Vitals and nursing note reviewed.  Constitutional:      General: He is not in acute distress.    Appearance: Normal appearance. He is not ill-appearing or toxic-appearing.  HENT:     Head: Normocephalic.     Jaw: There is normal jaw occlusion. No trismus.     Nose: Nose normal.     Mouth/Throat:     Mouth: Mucous membranes are moist.     Dentition: Dental tenderness present. No gingival swelling or dental abscesses.     Pharynx: Oropharynx is clear. Uvula midline. No uvula swelling.      Comments: The left lower most posterior molar is tender to palpation.  There is no periapical abscess.  There is no gingival swelling, dental fracture, dry or bleeding socket.  The floor of the mouth is soft.  Airway is intact.  No facial swelling. Eyes:     General: No scleral icterus. Pulmonary:     Effort: Pulmonary effort is normal. No respiratory distress.  Musculoskeletal:     Cervical back: Normal range of motion and neck supple. No tenderness.  Skin:    General: Skin is warm and dry.     Capillary Refill: Capillary refill takes less than 2 seconds.  Neurological:     General: No focal deficit present.     Mental Status: He is alert.  Psychiatric:        Mood and Affect: Mood normal.        Behavior: Behavior normal.     ED Results / Procedures / Treatments   Labs (all labs ordered are listed, but only abnormal results are displayed) Labs  Reviewed - No data to display  EKG None  Radiology No results found.  Procedures Procedures   Medications Ordered in ED Medications - No data to display  ED Course/ Medical Decision Making/ A&P    Medical Decision Making 37 year old male who presents to the emergency department with dental pain. PMH: Asthma Differential: Dental cary, deep space infection, tooth fracture, avulsion, bleeding socket, RPA, PTA, Ludwig's angina, periapical  abscess, etc    No indication for labs or imaging at this time.  MDM: 37 year old well-appearing male who presents to the emergency department with dental pain.  He is nonseptic and nontoxic appearing.  On physical exam he has left lower molar tenderness to palpation.  There is no dental fracture or avulsion.  There is no dental abscess.  There is no dry or bleeding socket.  The floor of the mouth is soft and I have low suspicion for Ludwig's angina.  Additionally there is no facial swelling, fever or concern for a deep space infection at this time.  Symptoms are inconsistent with RPA or PTA.  He likely has dental carry and possible dental infection causing his ongoing pain symptoms.  He was initially prescribed Augmentin which was changed to clindamycin on 12/28/2022.  He has not started taking the clindamycin yet however.  Additionally he has an appointment with dentistry on Friday, 01/05/2023.  Do not feel that he needs any advanced imaging at this time or laboratory workup.  Will prescribe him with some ongoing pain medication until he is able to start taking his clindamycin which should be on Wednesday when he gets paid.  Do not feel that giving him single dose of clindamycin here in the emergency department is useful.  Will hopefully have definitive management on Friday with dentistry.  I have given him return precautions for facial swelling or fevers.  Otherwise we will send him home with pain control.  He verbalized understanding.  Patient management  required discussion with the following services or consulting groups:  None  Complexity of Problems Addressed Acute uncomplicated illness or injury with no diagnostics  Additional Data Reviewed and Analyzed Further history obtained from: Past medical history and medications listed in the EMR, Prior ED visit notes, and Care Everywhere  Patient Encounter Risk Assessment Prescriptions, SDOH impact on management, and Use of parenteral controlled substances  Final Clinical Impression(s) / ED Diagnoses Final diagnoses:  Pain, dental    Rx / DC Orders ED Discharge Orders          Ordered    traMADol (ULTRAM) 50 MG tablet  Every 6 hours PRN        12/31/22 0148              Cristopher Peru, PA-C 12/31/22 0149    Sloan Leiter, DO 12/31/22 [phone]

## 2023-01-01 ENCOUNTER — Other Ambulatory Visit: Payer: Self-pay

## 2023-01-01 ENCOUNTER — Encounter (HOSPITAL_COMMUNITY): Payer: Self-pay

## 2023-01-01 ENCOUNTER — Emergency Department (HOSPITAL_COMMUNITY)
Admission: EM | Admit: 2023-01-01 | Discharge: 2023-01-01 | Disposition: A | Discharge status: Home / Self Care | Payer: Self-pay | Attending: Emergency Medicine | Admitting: Emergency Medicine

## 2023-01-01 DIAGNOSIS — K0889 Other specified disorders of teeth and supporting structures: Secondary | ICD-10-CM | POA: Diagnosis present

## 2023-01-01 DIAGNOSIS — J45909 Unspecified asthma, uncomplicated: Secondary | ICD-10-CM | POA: Insufficient documentation

## 2023-01-01 DIAGNOSIS — K029 Dental caries, unspecified: Secondary | ICD-10-CM | POA: Insufficient documentation

## 2023-01-01 DIAGNOSIS — F17210 Nicotine dependence, cigarettes, uncomplicated: Secondary | ICD-10-CM | POA: Insufficient documentation

## 2023-01-01 MED ORDER — OXYCODONE-ACETAMINOPHEN 5-325 MG PO TABS
2.0000 | ORAL_TABLET | Freq: Once | ORAL | Status: AC
  Administered 2023-01-01: 2 via ORAL
  Filled 2023-01-01: qty 2

## 2023-01-01 NOTE — ED Provider Notes (Signed)
[facility]-EMERGENCY DEPT Provider Note: Rodney Dell, MD, FACEP  CSN: [id number] MRN: [id number] ARRIVAL: 01/01/23 at 0135 ROOM: WA14/WA14   CHIEF COMPLAINT  Dental Pain   HISTORY OF PRESENT ILLNESS  01/01/23 2:45 AM Rodney White is a 37 y.o. male with dental pain due to a carious left lower molar.  This is his fourth visit to the ED for this since 12/22/2022.  He has been taking amoxicillin.  He was given prescriptions for clindamycin and tramadol yesterday but has not yet gotten them filled.  He plans to get them filled later today.  He was requesting something for pain.   Past Medical History:  Diagnosis Date   Asthma     History reviewed. No pertinent surgical history.  History reviewed. No pertinent family history.  Social History   Tobacco Use   Smoking status: Every Day    Packs/day: .5    Types: Cigarettes   Smokeless tobacco: Never  Substance Use Topics   Alcohol use: Yes   Drug use: Yes    Types: Marijuana    Prior to Admission medications   Medication Sig Start Date End Date Taking? Authorizing Provider  acetaminophen (TYLENOL) 325 MG tablet Take 2 tablets (650 mg total) by mouth every 6 (six) hours as needed. 12/22/22   Sloan Leiter, DO  benzonatate (TESSALON) 100 MG capsule Take 1 capsule (100 mg total) by mouth 3 (three) times daily as needed for cough. Patient not taking: Reported on 07/25/2019 11/25/17   Michela Pitcher A, PA-C  cephALEXin (KEFLEX) 500 MG capsule Take 1 capsule (500 mg total) by mouth 4 (four) times daily. 07/25/19   Palumbo, April, MD  clindamycin (CLEOCIN) 150 MG capsule Take 3 capsules (450 mg total) by mouth 3 (three) times daily for 7 days. 12/28/22 01/04/23  Melene Plan, DO  cyclobenzaprine (FLEXERIL) 10 MG tablet Take 1 tablet (10 mg total) by mouth 2 (two) times daily as needed for muscle spasms. Patient not taking: Reported on 07/25/2019 12/22/18   Hedges, Tinnie Gens, PA-C  Diclofenac Sodium CR 100 MG 24 hr tablet Take 1 tablet (100 mg total) by  mouth daily. 07/25/19   Palumbo, April, MD  dicyclomine (BENTYL) 20 MG tablet Take 1 tablet (20 mg total) by mouth 2 (two) times daily as needed for spasms. Patient not taking: Reported on 07/25/2019 08/31/14   Loren Racer, MD  diphenhydrAMINE (BENADRYL) 25 MG tablet Take 1 tablet (25 mg total) by mouth every 6 (six) hours. Take every 6 hours for 3 days, then as needed if symptoms return Patient taking differently: Take 25 mg by mouth as needed (for allergic reactions).  03/31/18   Elpidio Anis, PA-C  EPINEPHrine (EPIPEN 2-PAK) 0.3 mg/0.3 mL IJ SOAJ injection Inject 0.3 mg into the muscle as needed for anaphylaxis. 07/20/22   Clark, Meghan R, PA-C  famotidine (PEPCID) 20 MG tablet Take 1 tablet (20 mg total) by mouth 2 (two) times daily. Take twice daily for 3 days then as needed for recurrent symptoms Patient not taking: Reported on 07/25/2019 03/31/18   Elpidio Anis, PA-C  ibuprofen (ADVIL,MOTRIN) 600 MG tablet Take 1 tablet (600 mg total) by mouth every 6 (six) hours as needed. Patient not taking: Reported on 07/25/2019 07/12/16   Janne Napoleon, NP  methocarbamol (ROBAXIN) 500 MG tablet Take 1 tablet (500 mg total) by mouth 2 (two) times daily. 07/25/19   Palumbo, April, MD  ondansetron (ZOFRAN ODT) 4 MG disintegrating tablet Take 1 tablet (4 mg total) by mouth  every 8 (eight) hours as needed for nausea or vomiting. Patient not taking: Reported on 07/25/2019 04/03/19   Namon Cirri E, PA-C  ondansetron (ZOFRAN) 4 MG tablet Take 1 tablet (4 mg total) by mouth every 8 (eight) hours as needed for nausea or vomiting. Patient not taking: Reported on 07/25/2019 11/25/17   Michela Pitcher A, PA-C  traMADol (ULTRAM) 50 MG tablet Take 1 tablet (50 mg total) by mouth every 6 (six) hours as needed for up to 3 days. 12/31/22 01/03/23  Cristopher Peru, PA-C    Allergies Fruit & vegetable daily [nutritional supplements] and Ibuprofen   REVIEW OF SYSTEMS  Negative except as noted here or in the History of  Present Illness.   PHYSICAL EXAMINATION  Initial Vital Signs Blood pressure (!) 132/96, pulse 75, temperature 99 F (37.2 C), temperature source Oral, resp. rate 20, height 5\' 6"  (1.676 m), weight 82 kg, SpO2 98 %.  Examination General: Well-developed, well-nourished male in no acute distress; appearance consistent with age of record HENT: normocephalic; atraumatic; carious left lower back molar Eyes: Normal appearance Neck: supple Heart: regular rate and rhythm Lungs: clear to auscultation bilaterally Abdomen: soft; nondistended Extremities: No deformity; full range of motion Neurologic: Awake, alert and oriented; motor function intact in all extremities and symmetric; no facial droop Skin: Warm and dry Psychiatric: Grimacing   RESULTS  Summary of this visit's results, reviewed and interpreted by myself:   EKG Interpretation  Date/Time:    Ventricular Rate:    PR Interval:    QRS Duration:   QT Interval:    QTC Calculation:   R Axis:     Text Interpretation:         Laboratory Studies: No results found for this or any previous visit (from the past 24 hour(s)). Imaging Studies: No results found.  ED COURSE and MDM  Nursing notes, initial and subsequent vitals signs, including pulse oximetry, reviewed and interpreted by myself.  Vitals:   01/01/23 0139 01/01/23 0142  BP:  (!) 132/96  Pulse:  75  Resp:  20  Temp:  99 F (37.2 C)  TempSrc:  Oral  SpO2:  98%  Weight: 82 kg   Height: 5\' 6"  (1.676 m)    Medications  oxyCODONE-acetaminophen (PERCOCET/ROXICET) 5-325 MG per tablet 2 tablet (has no administration in time range)   The patient has an appointment with dentistry in 3 days.  He was advised to get his prescriptions filled as instructed.  PROCEDURES  Procedures   ED DIAGNOSES     ICD-10-CM   1. Pain due to dental caries  K02.9          Samira Acero, Jonny Ruiz, MD 01/01/23 [phone]

## 2023-01-01 NOTE — ED Triage Notes (Signed)
BIBA from home for Lt side upper jaw tooth pain seen at [facility] for this given abx and tylenol for pain not working

## 2023-06-11 ENCOUNTER — Other Ambulatory Visit: Payer: Self-pay

## 2023-06-11 ENCOUNTER — Emergency Department (HOSPITAL_COMMUNITY): Payer: Self-pay

## 2023-06-11 ENCOUNTER — Emergency Department (HOSPITAL_COMMUNITY)
Admission: EM | Admit: 2023-06-11 | Discharge: 2023-06-11 | Disposition: A | Discharge status: Home / Self Care | Payer: Self-pay | Attending: Emergency Medicine | Admitting: Emergency Medicine

## 2023-06-11 ENCOUNTER — Encounter (HOSPITAL_COMMUNITY): Payer: Self-pay

## 2023-06-11 DIAGNOSIS — Y92410 Unspecified street and highway as the place of occurrence of the external cause: Secondary | ICD-10-CM | POA: Diagnosis not present

## 2023-06-11 DIAGNOSIS — M79662 Pain in left lower leg: Secondary | ICD-10-CM | POA: Diagnosis present

## 2023-06-11 DIAGNOSIS — M25552 Pain in left hip: Secondary | ICD-10-CM | POA: Diagnosis not present

## 2023-06-11 DIAGNOSIS — M79605 Pain in left leg: Secondary | ICD-10-CM

## 2023-06-11 MED ORDER — CYCLOBENZAPRINE HCL 10 MG PO TABS
5.0000 mg | ORAL_TABLET | Freq: Once | ORAL | Status: AC
  Administered 2023-06-11: 5 mg via ORAL
  Filled 2023-06-11: qty 1

## 2023-06-11 MED ORDER — CYCLOBENZAPRINE HCL 10 MG PO TABS: 10.0000 mg | ORAL_TABLET | Freq: Two times a day (BID) | ORAL | 0 refills | Status: AC | PRN
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
## 2023-07-21 ENCOUNTER — Emergency Department (HOSPITAL_COMMUNITY)
Admission: EM | Admit: 2023-07-21 | Discharge: 2023-07-21 | Disposition: A | Discharge status: Home / Self Care | Payer: Self-pay | Attending: Emergency Medicine | Admitting: Emergency Medicine

## 2023-07-21 ENCOUNTER — Emergency Department (HOSPITAL_COMMUNITY): Payer: Self-pay

## 2023-07-21 ENCOUNTER — Other Ambulatory Visit: Payer: Self-pay

## 2023-07-21 ENCOUNTER — Encounter (HOSPITAL_COMMUNITY): Payer: Self-pay

## 2023-07-21 DIAGNOSIS — F17210 Nicotine dependence, cigarettes, uncomplicated: Secondary | ICD-10-CM | POA: Insufficient documentation

## 2023-07-21 DIAGNOSIS — R079 Chest pain, unspecified: Secondary | ICD-10-CM | POA: Diagnosis present

## 2023-07-21 DIAGNOSIS — R202 Paresthesia of skin: Secondary | ICD-10-CM | POA: Diagnosis not present

## 2023-07-21 DIAGNOSIS — J45909 Unspecified asthma, uncomplicated: Secondary | ICD-10-CM | POA: Insufficient documentation

## 2023-07-21 LAB — CBC
HCT: 42.3 % (ref 39.0–52.0)
Hemoglobin: 14 g/dL (ref 13.0–17.0)
MCH: 28.8 pg (ref 26.0–34.0)
MCHC: 33.1 g/dL (ref 30.0–36.0)
MCV: 87 fL (ref 80.0–100.0)
Platelets: 262 10*3/uL (ref 150–400)
RBC: 4.86 MIL/uL (ref 4.22–5.81)
RDW: 14.4 % (ref 11.5–15.5)
WBC: 6.8 10*3/uL (ref 4.0–10.5)
nRBC: 0 % (ref 0.0–0.2)

## 2023-07-21 LAB — BASIC METABOLIC PANEL
Anion gap: 11 (ref 5–15)
BUN: 14 mg/dL (ref 6–20)
CO2: 22 mmol/L (ref 22–32)
Calcium: 9.5 mg/dL (ref 8.9–10.3)
Chloride: 108 mmol/L (ref 98–111)
Creatinine, Ser: 0.95 mg/dL (ref 0.61–1.24)
GFR, Estimated: >60 mL/min (ref >60)
Glucose, Bld: 108 mg/dL — ABNORMAL HIGH (ref 70–99)
Potassium: 4 mmol/L (ref 3.5–5.1)
Sodium: 141 mmol/L (ref 135–145)

## 2023-07-21 LAB — TROPONIN I (HIGH SENSITIVITY): Troponin I (High Sensitivity): 5 ng/L (ref <18)

## 2023-07-21 MED ORDER — KETOROLAC TROMETHAMINE 15 MG/ML IJ SOLN
30.0000 mg | Freq: Once | INTRAMUSCULAR | Status: AC
  Administered 2023-07-21: 30 mg via INTRAVENOUS
  Filled 2023-07-21: qty 2

## 2023-07-21 NOTE — ED Triage Notes (Signed)
Pt presents via POV c/o chest pain for the last couple of days. Reports sharp chest pains to left side of his chest. Reports some SOB. Also reports intermittent left hand numbness. Ambulatory to triage. A&O x4.

## 2023-07-21 NOTE — ED Notes (Signed)
 Pt in NAD at d/c from ED. A&O. Ambulatory. Respirations even & unlabored. Skin warm & dry. Pt verbalized understanding of d/c teaching including follow up care, pain management and reasons to return to the ED. No needs or questions expressed at d/c.

## 2023-07-21 NOTE — Discharge Instructions (Signed)
You were evaluated in the Emergency Department and after careful evaluation, we did not find any emergent condition requiring admission or further testing in the hospital.  Your exam/testing today is overall reassuring.  No signs of heart damage or heart attack.  Recommend Tylenol or Motrin for discomfort, seems to be due to muscle strain or spasm.  Please return to the Emergency Department if you experience any worsening of your condition.   Thank you for allowing [place] to be a part of your care.

## 2023-07-21 NOTE — ED Provider Notes (Signed)
[facility]-EMERGENCY DEPT [facility] Emergency Department Provider Note MRN:  [id number]  Arrival date & time: 07/21/23     Chief Complaint   Chest Pain   History of Present Illness   Rodney White is a 37 y.o. year-old male with a history of asthma presenting to the ED with chief complaint of chest pain.  Central chest pain worse with certain movements and positions, worse with palpation, present for 1 or 2 days.  Associated with occasional tingling to the right hand but not currently.  No numbness or weakness, no shortness of breath, no fever or cough.  Review of Systems  A thorough review of systems was obtained and all systems are negative except as noted in the HPI and PMH.   Patient's Health History    Past Medical History:  Diagnosis Date   Asthma     History reviewed. No pertinent surgical history.  History reviewed. No pertinent family history.  Social History   Socioeconomic History   Marital status: Married    Spouse name: Not on file   Number of children: Not on file   Years of education: Not on file   Highest education level: Not on file  Occupational History   Not on file  Tobacco Use   Smoking status: Every Day    Current packs/day: 0.50    Types: Cigarettes   Smokeless tobacco: Never  Substance and Sexual Activity   Alcohol use: Yes   Drug use: Yes    Types: Marijuana   Sexual activity: Not on file  Other Topics Concern   Not on file  Social History Narrative   Not on file   Social Determinants of Health   Financial Resource Strain: Not on file  Food Insecurity: Not on file  Transportation Needs: Not on file  Physical Activity: Not on file  Stress: Not on file  Social Connections: Not on file  Intimate Partner Violence: Not on file     Physical Exam   Vitals:   07/21/23 0327  BP: (!) 164/103  Pulse: 84  Resp: 16  Temp: 98 F (36.7 C)  SpO2: 100%    CONSTITUTIONAL: Well-appearing, NAD NEURO/PSYCH:  Alert and oriented x 3, no  focal deficits EYES:  eyes equal and reactive ENT/NECK:  no LAD, no JVD CARDIO: Regular rate, well-perfused, normal S1 and S2 PULM:  CTAB no wheezing or rhonchi GI/GU:  non-distended, non-tender MSK/SPINE:  No gross deformities, no edema SKIN:  no rash, atraumatic   *Additional and/or pertinent findings included in MDM below  Diagnostic and Interventional Summary    EKG Interpretation Date/Time:  Monday July 21 2023 03:28:01 EST Ventricular Rate:  80 PR Interval:  144 QRS Duration:  74 QT Interval:  368 QTC Calculation: 424 R Axis:   91  Text Interpretation: Normal sinus rhythm Rightward axis T wave abnormality, consider inferior ischemia Abnormal ECG When compared with ECG of 31-Mar-2018 03:32, PREVIOUS ECG IS PRESENT Confirmed by Kennis Carina [phone]) on 07/21/2023 3:48:55 AM       Labs Reviewed  BASIC METABOLIC PANEL - Abnormal; Notable for the following components:      Result Value   Glucose, Bld 108 (*)    All other components within normal limits  CBC  TROPONIN I (HIGH SENSITIVITY)  TROPONIN I (HIGH SENSITIVITY)    DG Chest 2 View  Final Result      Medications  ketorolac (TORADOL) 15 MG/ML injection 30 mg (30 mg Intravenous Given 07/21/23 0448)  Procedures  /  Critical Care Procedures  ED Course and Medical Decision Making  Initial Impression and Ddx Favoring MSK related pain given the reproducibility on exam, worse with movement, positions.  No cardiovascular risk factors, PERC negative.  Past medical/surgical history that increases complexity of ED encounter: None  Interpretation of Diagnostics I personally reviewed the EKG and my interpretation is as follows: Sinus rhythm  Labs reassuring with no significant blood count or electrolyte disturbance, troponin negative  Patient Reassessment and Ultimate Disposition/Management     Discharge  Patient management required discussion with the following services or consulting groups:   None  Complexity of Problems Addressed Acute illness or injury that poses threat of life of bodily function  Additional Data Reviewed and Analyzed Further history obtained from: None  Additional Factors Impacting ED Encounter Risk None  Elmer Sow. Pilar Plate, MD [facility] Emergency Medicine [facility] [email or web address]  Final Clinical Impressions(s) / ED Diagnoses     ICD-10-CM   1. Chest pain, unspecified type  R07.9       ED Discharge Orders     None        Discharge Instructions Discussed with and Provided to Patient:    Discharge Instructions      You were evaluated in the Emergency Department and after careful evaluation, we did not find any emergent condition requiring admission or further testing in the hospital.  Your exam/testing today is overall reassuring.  No signs of heart damage or heart attack.  Recommend Tylenol or Motrin for discomfort, seems to be due to muscle strain or spasm.  Please return to the Emergency Department if you experience any worsening of your condition.   Thank you for allowing [place] to be a part of your care.      Sabas Sous, MD 07/21/23 [phone]

## 2024-02-10 ENCOUNTER — Emergency Department (HOSPITAL_COMMUNITY)
Admission: EM | Admit: 2024-02-10 | Discharge: 2024-02-11 | Disposition: A | Discharge status: Home / Self Care | Payer: Self-pay | Attending: Emergency Medicine | Admitting: Emergency Medicine

## 2024-02-10 DIAGNOSIS — J45909 Unspecified asthma, uncomplicated: Secondary | ICD-10-CM | POA: Diagnosis not present

## 2024-02-10 DIAGNOSIS — D72829 Elevated white blood cell count, unspecified: Secondary | ICD-10-CM | POA: Insufficient documentation

## 2024-02-10 DIAGNOSIS — J029 Acute pharyngitis, unspecified: Secondary | ICD-10-CM | POA: Diagnosis present

## 2024-02-10 DIAGNOSIS — J020 Streptococcal pharyngitis: Secondary | ICD-10-CM | POA: Insufficient documentation

## 2024-02-11 ENCOUNTER — Encounter (HOSPITAL_COMMUNITY): Payer: Self-pay | Admitting: Emergency Medicine

## 2024-02-11 ENCOUNTER — Emergency Department (HOSPITAL_COMMUNITY): Payer: Self-pay

## 2024-02-11 LAB — CBC WITH DIFFERENTIAL/PLATELET
Abs Immature Granulocytes: 0.05 10*3/uL (ref 0.00–0.07)
Basophils Absolute: 0 10*3/uL (ref 0.0–0.1)
Basophils Relative: 0 %
Eosinophils Absolute: 0.4 10*3/uL (ref 0.0–0.5)
Eosinophils Relative: 3 %
HCT: 39.8 % (ref 39.0–52.0)
Hemoglobin: 13.3 g/dL (ref 13.0–17.0)
Immature Granulocytes: 1 %
Lymphocytes Relative: 12 %
Lymphs Abs: 1.3 10*3/uL (ref 0.7–4.0)
MCH: 29.4 pg (ref 26.0–34.0)
MCHC: 33.4 g/dL (ref 30.0–36.0)
MCV: 88.1 fL (ref 80.0–100.0)
Monocytes Absolute: 1 10*3/uL (ref 0.1–1.0)
Monocytes Relative: 9 %
Neutro Abs: 8.2 10*3/uL — ABNORMAL HIGH (ref 1.7–7.7)
Neutrophils Relative %: 75 %
Platelets: 255 10*3/uL (ref 150–400)
RBC: 4.52 MIL/uL (ref 4.22–5.81)
RDW: 13.7 % (ref 11.5–15.5)
WBC: 10.9 10*3/uL — ABNORMAL HIGH (ref 4.0–10.5)
nRBC: 0 % (ref 0.0–0.2)

## 2024-02-11 LAB — URINALYSIS, ROUTINE W REFLEX MICROSCOPIC
Bilirubin Urine: NEGATIVE
Glucose, UA: NEGATIVE mg/dL
Hgb urine dipstick: NEGATIVE
Ketones, ur: NEGATIVE mg/dL
Leukocytes,Ua: NEGATIVE
Nitrite: NEGATIVE
Protein, ur: NEGATIVE mg/dL
Specific Gravity, Urine: 1.013 (ref 1.005–1.030)
pH: 5 (ref 5.0–8.0)

## 2024-02-11 LAB — COMPREHENSIVE METABOLIC PANEL WITH GFR
ALT: 21 U/L (ref 0–44)
AST: 22 U/L (ref 15–41)
Albumin: 3.7 g/dL (ref 3.5–5.0)
Alkaline Phosphatase: 65 U/L (ref 38–126)
Anion gap: 10 (ref 5–15)
BUN: 9 mg/dL (ref 6–20)
CO2: 21 mmol/L — ABNORMAL LOW (ref 22–32)
Calcium: 8.6 mg/dL — ABNORMAL LOW (ref 8.9–10.3)
Chloride: 101 mmol/L (ref 98–111)
Creatinine, Ser: 0.88 mg/dL (ref 0.61–1.24)
GFR, Estimated: >60 mL/min (ref >60)
Glucose, Bld: 113 mg/dL — ABNORMAL HIGH (ref 70–99)
Potassium: 3.5 mmol/L (ref 3.5–5.1)
Sodium: 132 mmol/L — ABNORMAL LOW (ref 135–145)
Total Bilirubin: 0.7 mg/dL (ref 0.0–1.2)
Total Protein: 7.6 g/dL (ref 6.5–8.1)

## 2024-02-11 LAB — GROUP A STREP BY PCR: Group A Strep by PCR: DETECTED — AB

## 2024-02-11 MED ORDER — ONDANSETRON 4 MG PO TBDP
4.0000 mg | ORAL_TABLET | Freq: Once | ORAL | Status: AC
  Administered 2024-02-11: 4 mg via ORAL
  Filled 2024-02-11: qty 1

## 2024-02-11 MED ORDER — PENICILLIN G BENZATHINE 1200000 UNIT/2ML IM SUSY
1.2000 10*6.[IU] | PREFILLED_SYRINGE | Freq: Once | INTRAMUSCULAR | Status: AC
  Administered 2024-02-11: 1.2 10*6.[IU] via INTRAMUSCULAR
  Filled 2024-02-11: qty 2

## 2024-02-11 NOTE — ED Triage Notes (Signed)
 PT reports sore throat, congestion, sinus pressure, cough for 4 days to a week. Not getting better. Pt reports he has vomited 5 times today. States been feeling very fatigued.

## 2024-02-11 NOTE — Discharge Instructions (Addendum)
 You tested positive for strep throat today.  You have been treated with one-time dose of antibiotics here.  Symptoms should start improving in the next 24 to 48 hours. Can continue Tylenol  or Motrin  as needed.  Make sure to stay well-hydrated.   Follow-up with your primary care doctor if any ongoing issues. Return here for new concerns.

## 2024-02-11 NOTE — ED Provider Notes (Signed)
 [place] EMERGENCY DEPARTMENT AT [facility] Provider Note   CSN: [id number] Arrival date & time: 02/10/24  2300     Patient presents with: URI   Rodney White is a 38 y.o. male.   The history is provided by the patient and medical records.  URI Presenting symptoms: congestion, cough, fatigue and sore throat    38 year old male with history of asthma, presenting to the ED with URI symptoms over the last 4 days.  He reports sore throat, congestion, sinus pressure, and dry cough.  Denies any sick contacts with similar.  He has not had any noted fever.  Did have a few episodes of vomiting today.  Has still been trying to take in fluids is much as he can.  States overall just feels very fatigued.  No medication taken prior to arrival.  Prior to Admission medications   Medication Sig Start Date End Date Taking? Authorizing Provider  acetaminophen  (TYLENOL ) 325 MG tablet Take 2 tablets (650 mg total) by mouth every 6 (six) hours as needed. 12/22/22   Elnor Jayson LABOR, DO  cephALEXin  (KEFLEX ) 500 MG capsule Take 1 capsule (500 mg total) by mouth 4 (four) times daily. 07/25/19   Palumbo, April, MD  cyclobenzaprine  (FLEXERIL ) 10 MG tablet Take 1 tablet (10 mg total) by mouth 2 (two) times daily as needed for muscle spasms. 06/11/23   Schutt, Marsa HERO, PA-C  Diclofenac  Sodium CR 100 MG 24 hr tablet Take 1 tablet (100 mg total) by mouth daily. 07/25/19   Palumbo, April, MD  diphenhydrAMINE  (BENADRYL ) 25 MG tablet Take 1 tablet (25 mg total) by mouth every 6 (six) hours. Take every 6 hours for 3 days, then as needed if symptoms return Patient taking differently: Take 25 mg by mouth as needed (for allergic reactions).  03/31/18   Odell Balls, PA-C  EPINEPHrine  (EPIPEN  2-PAK) 0.3 mg/0.3 mL IJ SOAJ injection Inject 0.3 mg into the muscle as needed for anaphylaxis. 07/20/22   Clark, Meghan R, PA-C  methocarbamol  (ROBAXIN ) 500 MG tablet Take 1 tablet (500 mg total) by mouth 2 (two) times  daily. 07/25/19   Palumbo, April, MD    Allergies: Fruit & vegetable daily [nutritional supplements]    Review of Systems  Constitutional:  Positive for fatigue.  HENT:  Positive for congestion and sore throat.   Respiratory:  Positive for cough.   All other systems reviewed and are negative.   Updated Vital Signs BP (!) 142/87 (BP Location: Right Arm)   Pulse (!) 108   Temp 98.8 F (37.1 C) (Oral)   Resp 20   SpO2 92%   Physical Exam Vitals and nursing note reviewed.  Constitutional:      Appearance: He is well-developed.  HENT:     Head: Normocephalic and atraumatic.     Mouth/Throat:     Comments: Tonsils overall normal in appearance bilaterally without exudate, slight erythema of posterior oropharynx; uvula midline without evidence of peritonsillar abscess; handling secretions appropriately; no difficulty swallowing or speaking; normal phonation without stridor  Eyes:     Conjunctiva/sclera: Conjunctivae normal.     Pupils: Pupils are equal, round, and reactive to light.    Cardiovascular:     Rate and Rhythm: Normal rate and regular rhythm.     Heart sounds: Normal heart sounds.  Pulmonary:     Effort: Pulmonary effort is normal.     Breath sounds: Normal breath sounds. No wheezing or rhonchi.  Abdominal:     General: Bowel sounds  are normal.     Palpations: Abdomen is soft.     Tenderness: There is no abdominal tenderness. There is no rebound. Negative signs include Murphy's sign.     Comments: Soft, non-tender   Musculoskeletal:        General: Normal range of motion.     Cervical back: Normal range of motion.   Skin:    General: Skin is warm and dry.   Neurological:     Mental Status: He is alert and oriented to person, place, and time.     (all labs ordered are listed, but only abnormal results are displayed) Labs Reviewed  GROUP A STREP BY PCR - Abnormal; Notable for the following components:      Result Value   Group A Strep by PCR DETECTED (*)     All other components within normal limits  COMPREHENSIVE METABOLIC PANEL WITH GFR - Abnormal; Notable for the following components:   Sodium 132 (*)    CO2 21 (*)    Glucose, Bld 113 (*)    Calcium 8.6 (*)    All other components within normal limits  CBC WITH DIFFERENTIAL/PLATELET - Abnormal; Notable for the following components:   WBC 10.9 (*)    Neutro Abs 8.2 (*)    All other components within normal limits  URINALYSIS, ROUTINE W REFLEX MICROSCOPIC    EKG: None  Radiology: DG Chest 2 View Result Date: 02/11/2024 CLINICAL DATA:  Cough, fever EXAM: CHEST - 2 VIEW COMPARISON:  None Available. FINDINGS: The heart size and mediastinal contours are within normal limits. Both lungs are clear. The visualized skeletal structures are unremarkable. IMPRESSION: No active cardiopulmonary disease. Electronically Signed   By: Dorethia Molt M.D.   On: 02/11/2024 01:20     Procedures   Medications Ordered in the ED  ondansetron  (ZOFRAN -ODT) disintegrating tablet 4 mg (4 mg Oral Given 02/11/24 0412)  penicillin  g benzathine (BICILLIN  LA) 1200000 UNIT/2ML injection 1.2 Million Units (1.2 Million Units Intramuscular Given 02/11/24 0412)                                    Medical Decision Making Amount and/or Complexity of Data Reviewed Labs: ordered. Radiology: ordered and independent interpretation performed. ECG/medicine tests: ordered and independent interpretation performed.  Risk Prescription drug management.   38 year old male here with 4 days of URI symptoms including sore throat, congestion, and cough.  Denies any sick contacts.  Afebrile, nontoxic in appearance here.  No significant tonsillar edema or exudates on exam but does have some oropharyngeal erythema.  Hand excretions, no stridor.  Labs are grossly reassuring--mild leukocytosis but no electrolyte derangement.  Chest x-ray is clear.  Strep screen is positive.  He was treated with a dose of Bicillin  here.  Plan to  discharge home with continued symptomatic care.  Can follow-up with PCP.  Return here for new concerns.  Final diagnoses:  Strep pharyngitis    ED Discharge Orders     None          Jarold Olam HERO, PA-C 02/11/24 0430    Bari Charmaine FALCON, MD 02/13/24 [id number]

## 2024-08-31 ENCOUNTER — Emergency Department (HOSPITAL_COMMUNITY): Admission: EM | Admit: 2024-08-31 | Discharge: 2024-08-31 | Disposition: A | Discharge status: Home / Self Care

## 2024-08-31 ENCOUNTER — Other Ambulatory Visit: Payer: Self-pay

## 2024-08-31 ENCOUNTER — Emergency Department (HOSPITAL_COMMUNITY)

## 2024-08-31 ENCOUNTER — Encounter (HOSPITAL_COMMUNITY): Payer: Self-pay | Admitting: Emergency Medicine

## 2024-08-31 DIAGNOSIS — J181 Lobar pneumonia, unspecified organism: Secondary | ICD-10-CM | POA: Diagnosis not present

## 2024-08-31 DIAGNOSIS — J189 Pneumonia, unspecified organism: Secondary | ICD-10-CM

## 2024-08-31 DIAGNOSIS — R1084 Generalized abdominal pain: Secondary | ICD-10-CM | POA: Diagnosis not present

## 2024-08-31 DIAGNOSIS — R059 Cough, unspecified: Secondary | ICD-10-CM | POA: Diagnosis present

## 2024-08-31 LAB — CBC
HCT: 45.8 % (ref 39.0–52.0)
Hemoglobin: 16 g/dL (ref 13.0–17.0)
MCH: 30.1 pg (ref 26.0–34.0)
MCHC: 34.9 g/dL (ref 30.0–36.0)
MCV: 86.3 fL (ref 80.0–100.0)
Platelets: 342 K/uL (ref 150–400)
RBC: 5.31 MIL/uL (ref 4.22–5.81)
RDW: 14.4 % (ref 11.5–15.5)
WBC: 6.7 K/uL (ref 4.0–10.5)
nRBC: 0 % (ref 0.0–0.2)

## 2024-08-31 LAB — URINALYSIS, ROUTINE W REFLEX MICROSCOPIC
Bacteria, UA: NONE SEEN
Bilirubin Urine: NEGATIVE
Glucose, UA: NEGATIVE mg/dL
Hgb urine dipstick: NEGATIVE
Ketones, ur: NEGATIVE mg/dL
Leukocytes,Ua: NEGATIVE
Nitrite: NEGATIVE
Protein, ur: NEGATIVE mg/dL
Specific Gravity, Urine: 1.016 (ref 1.005–1.030)
pH: 5 (ref 5.0–8.0)

## 2024-08-31 LAB — COMPREHENSIVE METABOLIC PANEL WITH GFR
ALT: 22 U/L (ref 0–44)
AST: 24 U/L (ref 15–41)
Albumin: 4.1 g/dL (ref 3.5–5.0)
Alkaline Phosphatase: 68 U/L (ref 38–126)
Anion gap: 11 (ref 5–15)
BUN: 10 mg/dL (ref 6–20)
CO2: 23 mmol/L (ref 22–32)
Calcium: 9.2 mg/dL (ref 8.9–10.3)
Chloride: 103 mmol/L (ref 98–111)
Creatinine, Ser: 0.73 mg/dL (ref 0.61–1.24)
GFR, Estimated: >60 mL/min
Glucose, Bld: 107 mg/dL — ABNORMAL HIGH (ref 70–99)
Potassium: 4 mmol/L (ref 3.5–5.1)
Sodium: 137 mmol/L (ref 135–145)
Total Bilirubin: 0.2 mg/dL (ref 0.0–1.2)
Total Protein: 7.3 g/dL (ref 6.5–8.1)

## 2024-08-31 LAB — LIPASE, BLOOD: Lipase: 32 U/L (ref 11–51)

## 2024-08-31 MED ORDER — SODIUM CHLORIDE 0.9 % IV SOLN: 500.0000 mg | Freq: Once | INTRAVENOUS | Status: DC

## 2024-08-31 MED ORDER — SODIUM CHLORIDE 0.9 % IV BOLUS
1000.0000 mL | Freq: Once | INTRAVENOUS | Status: AC
  Administered 2024-08-31: 1000 mL via INTRAVENOUS

## 2024-08-31 MED ORDER — SODIUM CHLORIDE 0.9 % IV SOLN
1.0000 g | Freq: Once | INTRAVENOUS | Status: AC
  Administered 2024-08-31: 1 g via INTRAVENOUS
  Filled 2024-08-31: qty 10

## 2024-08-31 MED ORDER — ONDANSETRON HCL 4 MG PO TABS: 4.0000 mg | ORAL_TABLET | Freq: Three times a day (TID) | ORAL | 0 refills | Status: AC | PRN

## 2024-08-31 MED ORDER — SODIUM CHLORIDE 0.9 % IV SOLN
100.0000 mg | Freq: Once | INTRAVENOUS | Status: AC
  Administered 2024-08-31: 100 mg via INTRAVENOUS
  Filled 2024-08-31: qty 100

## 2024-08-31 MED ORDER — AMOXICILLIN 500 MG PO CAPS: 500.0000 mg | ORAL_CAPSULE | Freq: Three times a day (TID) | ORAL | 0 refills | Status: AC

## 2024-08-31 MED ORDER — OXYCODONE-ACETAMINOPHEN 5-325 MG PO TABS
1.0000 | ORAL_TABLET | ORAL | Status: AC | PRN
  Administered 2024-08-31 (×2): 1 via ORAL
  Filled 2024-08-31 (×2): qty 1

## 2024-08-31 MED ORDER — KETOROLAC TROMETHAMINE 15 MG/ML IJ SOLN
15.0000 mg | Freq: Once | INTRAMUSCULAR | Status: AC
  Administered 2024-08-31: 15 mg via INTRAVENOUS
  Filled 2024-08-31: qty 1

## 2024-08-31 MED ORDER — ONDANSETRON 4 MG PO TBDP
4.0000 mg | ORAL_TABLET | Freq: Once | ORAL | Status: AC | PRN
  Administered 2024-08-31: 4 mg via ORAL
  Filled 2024-08-31: qty 1

## 2024-08-31 MED ORDER — DOXYCYCLINE HYCLATE 100 MG PO CAPS: 100.0000 mg | ORAL_CAPSULE | Freq: Two times a day (BID) | ORAL | 0 refills | Status: AC

## 2024-08-31 NOTE — ED Provider Notes (Signed)
 " [place] EMERGENCY DEPARTMENT AT [facility] Provider Note   CSN: [id number] Arrival date & time: 08/31/24  9663     Patient presents with: Emesis and Diarrhea   Rodney White is a 39 y.o. male.   The history is provided by the patient and medical records. No language interpreter was used.  Emesis Associated symptoms: diarrhea   Diarrhea Associated symptoms: vomiting      39 year old male presenting with complaint of cold symptoms.  Patient states for the past 3 days he has had sinus congestion, sore throat, headache, cough, congestion, as well as having nausea vomiting and diarrhea.  States he feels tired, and dehydrated.  He endorsed having abdominal pain as well.  He does not endorse any urinary discomfort no shortness of breath and no blood in his urine.  He has tried over-the-counter cough and cold medication without adequate relief.  He denies any recent sick contacts.  Prior to Admission medications  Medication Sig Start Date End Date Taking? Authorizing Provider  acetaminophen  (TYLENOL ) 325 MG tablet Take 2 tablets (650 mg total) by mouth every 6 (six) hours as needed. 12/22/22   Elnor Jayson LABOR, DO  cephALEXin  (KEFLEX ) 500 MG capsule Take 1 capsule (500 mg total) by mouth 4 (four) times daily. 07/25/19   Palumbo, April, MD  cyclobenzaprine  (FLEXERIL ) 10 MG tablet Take 1 tablet (10 mg total) by mouth 2 (two) times daily as needed for muscle spasms. 06/11/23   Schutt, Marsa HERO, PA-C  Diclofenac  Sodium CR 100 MG 24 hr tablet Take 1 tablet (100 mg total) by mouth daily. 07/25/19   Palumbo, April, MD  diphenhydrAMINE  (BENADRYL ) 25 MG tablet Take 1 tablet (25 mg total) by mouth every 6 (six) hours. Take every 6 hours for 3 days, then as needed if symptoms return Patient taking differently: Take 25 mg by mouth as needed (for allergic reactions).  03/31/18   Odell Balls, PA-C  EPINEPHrine  (EPIPEN  2-PAK) 0.3 mg/0.3 mL IJ SOAJ injection Inject 0.3 mg into the muscle as  needed for anaphylaxis. 07/20/22   Clark, Meghan R, PA-C  methocarbamol  (ROBAXIN ) 500 MG tablet Take 1 tablet (500 mg total) by mouth 2 (two) times daily. 07/25/19   Palumbo, April, MD    Allergies: Fruit & vegetable daily [nutritional supplements]    Review of Systems  Gastrointestinal:  Positive for diarrhea and vomiting.  All other systems reviewed and are negative.   Updated Vital Signs BP (!) 134/92   Pulse (!) 102   Temp 98 F (36.7 C)   Resp 16   SpO2 99%   Physical Exam Constitutional:      General: He is not in acute distress.    Appearance: He is well-developed.  HENT:     Head: Atraumatic.  Eyes:     Conjunctiva/sclera: Conjunctivae normal.  Cardiovascular:     Rate and Rhythm: Normal rate and regular rhythm.     Pulses: Normal pulses.     Heart sounds: Normal heart sounds.  Pulmonary:     Effort: Pulmonary effort is normal.     Breath sounds: Normal breath sounds. No wheezing, rhonchi or rales.  Abdominal:     Tenderness: There is abdominal tenderness (Diffuse abdominal tenderness no guarding no rebound tenderness).  Musculoskeletal:     Cervical back: Normal range of motion and neck supple.  Skin:    Findings: No rash.  Neurological:     Mental Status: He is alert.     (all labs ordered are  listed, but only abnormal results are displayed) Labs Reviewed  COMPREHENSIVE METABOLIC PANEL WITH GFR - Abnormal; Notable for the following components:      Result Value   Glucose, Bld 107 (*)    All other components within normal limits  CBC  URINALYSIS, ROUTINE W REFLEX MICROSCOPIC  LIPASE, BLOOD    EKG: None  Radiology: DG Chest 2 View Result Date: 08/31/2024 CLINICAL DATA:  Cough. Nausea. Vomiting. Diarrhea. Low back pain. EXAM: CHEST - 2 VIEW COMPARISON:  02/11/2024 FINDINGS: Cardiomediastinal silhouette and pulmonary vasculature are within normal limits. Mild hazy opacity at the RIGHT lung base suspicious for pneumonia. Lungs are otherwise clear.  IMPRESSION: Mild hazy opacity the RIGHT lung base is suspicious for pneumonia. Electronically Signed   By: Aliene Lloyd M.D.   On: 08/31/2024 09:19     Procedures   Medications Ordered in the ED  doxycycline  (VIBRAMYCIN ) 100 mg in sodium chloride  0.9 % 250 mL IVPB (100 mg Intravenous New Bag/Given 08/31/24 1211)  ondansetron  (ZOFRAN -ODT) disintegrating tablet 4 mg (4 mg Oral Given 08/31/24 0359)  oxyCODONE -acetaminophen  (PERCOCET/ROXICET) 5-325 MG per tablet 1 tablet (1 tablet Oral Given 08/31/24 1033)  sodium chloride  0.9 % bolus 1,000 mL (1,000 mLs Intravenous New Bag/Given 08/31/24 1057)  ketorolac  (TORADOL ) 15 MG/ML injection 15 mg (15 mg Intravenous Given 08/31/24 1034)  cefTRIAXone  (ROCEPHIN ) 1 g in sodium chloride  0.9 % 100 mL IVPB (1 g Intravenous New Bag/Given 08/31/24 1135)                                    Medical Decision Making Amount and/or Complexity of Data Reviewed Labs: ordered. Radiology: ordered.  Risk Prescription drug management.   BP (!) 134/92   Pulse (!) 102   Temp 98 F (36.7 C)   Resp 16   SpO2 99%   89:34 AM  39 year old male presenting with complaint of cold symptoms.  Patient states for the past 3 days he has had sinus congestion, sore throat, headache, cough, congestion, as well as having nausea vomiting and diarrhea.  States he feels tired, and dehydrated.  He endorsed having abdominal pain as well.  He does not endorse any urinary discomfort no shortness of breath and no blood in his urine.  He has tried over-the-counter cough and cold medication without adequate relief.  He denies any recent sick contacts.  Exam notable for mild generalized abdominal tenderness without focal point tenderness.  Patient is also coughing in the room.  -Labs ordered, independently viewed and interpreted by me.  Labs remarkable for reassuring labs -The patient was maintained on a cardiac monitor.  I personally viewed and interpreted the cardiac monitored which showed  an underlying rhythm of: SR -Imaging independently viewed and interpreted by me and I agree with radiologist's interpretation.  Result remarkable for CXR showing RLL pna. -This patient presents to the ED for concern of cold sxs, this involves an extensive number of treatment options, and is a complaint that carries with it a high risk of complications and morbidity.  The differential diagnosis includes covid, flu, rsv, pna, viral illness -Co morbidities that complicate the patient evaluation includes none -Treatment includes rocephin , doxycyline, zofran , ivf -Reevaluation of the patient after these medicines showed that the patient improved -PCP office notes or outside notes reviewed -Escalation to admission/observation considered: patients feels much better, is comfortable with discharge, and will follow up with PCP -Prescription medication considered, patient comfortable  with abx, antiemetic -Social Determinant of Health considered which includes tobacco use      Final diagnoses:  Pneumonia of right lower lobe due to infectious organism    ED Discharge Orders          Ordered    amoxicillin  (AMOXIL ) 500 MG capsule  3 times daily        08/31/24 1305    doxycycline  (VIBRAMYCIN ) 100 MG capsule  2 times daily        08/31/24 1305    ondansetron  (ZOFRAN ) 4 MG tablet  Every 8 hours PRN        08/31/24 1305               Nivia Colon, PA-C 08/31/24 1308  "

## 2024-08-31 NOTE — Discharge Instructions (Addendum)
 You have been diagnosed with pneumonia.  I have prescribed antibiotic through the mail pharmacy for a much better price than local pharmacy.  Take medications as prescribed.  Follow-up closely with your doctor for further care.  Return if you have any concern.

## 2024-08-31 NOTE — ED Triage Notes (Signed)
 Pt c/o N/V/D x 2 days. Also c/o lower back pain. Denies abd pain at this time. States he feels dehydrated.
